# Patient Record
Sex: Female | Born: 1935 | Race: White | Hispanic: No | Marital: Married | Smoking: Never smoker
Health system: Southern US, Community
[De-identification: ages and names within clinical notes are randomized; demographics above are authoritative.]

## PROBLEM LIST (undated history)

## (undated) DIAGNOSIS — Q210 Ventricular septal defect: Secondary | ICD-10-CM

## (undated) DIAGNOSIS — I10 Essential (primary) hypertension: Secondary | ICD-10-CM

## (undated) DIAGNOSIS — G629 Polyneuropathy, unspecified: Secondary | ICD-10-CM

## (undated) DIAGNOSIS — R2681 Unsteadiness on feet: Secondary | ICD-10-CM

## (undated) DIAGNOSIS — I639 Cerebral infarction, unspecified: Secondary | ICD-10-CM

## (undated) DIAGNOSIS — E538 Deficiency of other specified B group vitamins: Secondary | ICD-10-CM

## (undated) DIAGNOSIS — M810 Age-related osteoporosis without current pathological fracture: Secondary | ICD-10-CM

## (undated) DIAGNOSIS — Z01810 Encounter for preprocedural cardiovascular examination: Secondary | ICD-10-CM

## (undated) DIAGNOSIS — G4700 Insomnia, unspecified: Secondary | ICD-10-CM

## (undated) DIAGNOSIS — J309 Allergic rhinitis, unspecified: Secondary | ICD-10-CM

## (undated) DIAGNOSIS — Z9889 Other specified postprocedural states: Secondary | ICD-10-CM

## (undated) DIAGNOSIS — F419 Anxiety disorder, unspecified: Secondary | ICD-10-CM

## (undated) DIAGNOSIS — I714 Abdominal aortic aneurysm, without rupture: Secondary | ICD-10-CM

## (undated) DIAGNOSIS — E875 Hyperkalemia: Secondary | ICD-10-CM

## (undated) DIAGNOSIS — R079 Chest pain, unspecified: Secondary | ICD-10-CM

## (undated) DIAGNOSIS — K219 Gastro-esophageal reflux disease without esophagitis: Secondary | ICD-10-CM

## (undated) HISTORY — DX: Abdominal aortic aneurysm, without rupture: I71.4

## (undated) HISTORY — DX: Encounter for preprocedural cardiovascular examination: Z01.810

## (undated) HISTORY — DX: Chest pain, unspecified: R07.9

## (undated) HISTORY — DX: Gastro-esophageal reflux disease without esophagitis: K21.9

## (undated) HISTORY — PX: CHOLECYSTECTOMY: SHX55

## (undated) HISTORY — PX: ABDOMINAL HYSTERECTOMY: SHX81

## (undated) HISTORY — DX: Cerebral infarction, unspecified: I63.9

## (undated) HISTORY — DX: Ventricular septal defect: Q21.0

## (undated) HISTORY — DX: Polyneuropathy, unspecified: G62.9

## (undated) HISTORY — DX: Age-related osteoporosis without current pathological fracture: M81.0

## (undated) HISTORY — PX: CATARACT EXTRACTION: SUR2

## (undated) HISTORY — DX: Unsteadiness on feet: R26.81

## (undated) HISTORY — DX: Anxiety disorder, unspecified: F41.9

## (undated) HISTORY — DX: Essential (primary) hypertension: I10

## (undated) HISTORY — DX: Allergic rhinitis, unspecified: J30.9

## (undated) HISTORY — PX: CARDIAC SURGERY: SHX584

## (undated) HISTORY — DX: Insomnia, unspecified: G47.00

## (undated) HISTORY — DX: Hyperkalemia: E87.5

## (undated) HISTORY — DX: Deficiency of other specified B group vitamins: E53.8

---

## 2014-04-06 DIAGNOSIS — K219 Gastro-esophageal reflux disease without esophagitis: Secondary | ICD-10-CM | POA: Diagnosis not present

## 2014-04-06 DIAGNOSIS — N184 Chronic kidney disease, stage 4 (severe): Secondary | ICD-10-CM | POA: Diagnosis not present

## 2014-04-06 DIAGNOSIS — E780 Pure hypercholesterolemia: Secondary | ICD-10-CM | POA: Diagnosis not present

## 2014-04-06 DIAGNOSIS — I6789 Other cerebrovascular disease: Secondary | ICD-10-CM | POA: Diagnosis not present

## 2014-04-06 DIAGNOSIS — M810 Age-related osteoporosis without current pathological fracture: Secondary | ICD-10-CM | POA: Diagnosis not present

## 2014-04-06 DIAGNOSIS — I10 Essential (primary) hypertension: Secondary | ICD-10-CM | POA: Diagnosis not present

## 2014-04-06 DIAGNOSIS — R609 Edema, unspecified: Secondary | ICD-10-CM | POA: Diagnosis not present

## 2014-04-06 DIAGNOSIS — M159 Polyosteoarthritis, unspecified: Secondary | ICD-10-CM | POA: Diagnosis not present

## 2014-06-05 DIAGNOSIS — R609 Edema, unspecified: Secondary | ICD-10-CM | POA: Diagnosis not present

## 2014-06-05 DIAGNOSIS — E780 Pure hypercholesterolemia: Secondary | ICD-10-CM | POA: Diagnosis not present

## 2014-06-05 DIAGNOSIS — M810 Age-related osteoporosis without current pathological fracture: Secondary | ICD-10-CM | POA: Diagnosis not present

## 2014-06-05 DIAGNOSIS — I10 Essential (primary) hypertension: Secondary | ICD-10-CM | POA: Diagnosis not present

## 2014-06-05 DIAGNOSIS — M25561 Pain in right knee: Secondary | ICD-10-CM | POA: Diagnosis not present

## 2014-06-05 DIAGNOSIS — N184 Chronic kidney disease, stage 4 (severe): Secondary | ICD-10-CM | POA: Diagnosis not present

## 2014-06-05 DIAGNOSIS — M159 Polyosteoarthritis, unspecified: Secondary | ICD-10-CM | POA: Diagnosis not present

## 2014-06-05 DIAGNOSIS — I6789 Other cerebrovascular disease: Secondary | ICD-10-CM | POA: Diagnosis not present

## 2014-06-08 DIAGNOSIS — Z6822 Body mass index (BMI) 22.0-22.9, adult: Secondary | ICD-10-CM | POA: Diagnosis not present

## 2014-06-08 DIAGNOSIS — N390 Urinary tract infection, site not specified: Secondary | ICD-10-CM | POA: Diagnosis not present

## 2014-07-06 DIAGNOSIS — M25512 Pain in left shoulder: Secondary | ICD-10-CM | POA: Diagnosis not present

## 2014-07-06 DIAGNOSIS — N184 Chronic kidney disease, stage 4 (severe): Secondary | ICD-10-CM | POA: Diagnosis not present

## 2014-07-06 DIAGNOSIS — I6789 Other cerebrovascular disease: Secondary | ICD-10-CM | POA: Diagnosis not present

## 2014-07-06 DIAGNOSIS — M159 Polyosteoarthritis, unspecified: Secondary | ICD-10-CM | POA: Diagnosis not present

## 2014-07-06 DIAGNOSIS — K219 Gastro-esophageal reflux disease without esophagitis: Secondary | ICD-10-CM | POA: Diagnosis not present

## 2014-07-06 DIAGNOSIS — R609 Edema, unspecified: Secondary | ICD-10-CM | POA: Diagnosis not present

## 2014-07-06 DIAGNOSIS — I10 Essential (primary) hypertension: Secondary | ICD-10-CM | POA: Diagnosis not present

## 2014-07-06 DIAGNOSIS — E780 Pure hypercholesterolemia: Secondary | ICD-10-CM | POA: Diagnosis not present

## 2014-07-16 DIAGNOSIS — K219 Gastro-esophageal reflux disease without esophagitis: Secondary | ICD-10-CM | POA: Diagnosis not present

## 2014-07-16 DIAGNOSIS — I6789 Other cerebrovascular disease: Secondary | ICD-10-CM | POA: Diagnosis not present

## 2014-07-16 DIAGNOSIS — R609 Edema, unspecified: Secondary | ICD-10-CM | POA: Diagnosis not present

## 2014-07-16 DIAGNOSIS — I10 Essential (primary) hypertension: Secondary | ICD-10-CM | POA: Diagnosis not present

## 2014-07-16 DIAGNOSIS — N184 Chronic kidney disease, stage 4 (severe): Secondary | ICD-10-CM | POA: Diagnosis not present

## 2014-07-16 DIAGNOSIS — E780 Pure hypercholesterolemia: Secondary | ICD-10-CM | POA: Diagnosis not present

## 2014-07-16 DIAGNOSIS — M159 Polyosteoarthritis, unspecified: Secondary | ICD-10-CM | POA: Diagnosis not present

## 2014-07-16 DIAGNOSIS — M810 Age-related osteoporosis without current pathological fracture: Secondary | ICD-10-CM | POA: Diagnosis not present

## 2014-07-24 DIAGNOSIS — I6789 Other cerebrovascular disease: Secondary | ICD-10-CM | POA: Diagnosis not present

## 2014-07-24 DIAGNOSIS — K219 Gastro-esophageal reflux disease without esophagitis: Secondary | ICD-10-CM | POA: Diagnosis not present

## 2014-07-24 DIAGNOSIS — N184 Chronic kidney disease, stage 4 (severe): Secondary | ICD-10-CM | POA: Diagnosis not present

## 2014-07-24 DIAGNOSIS — R609 Edema, unspecified: Secondary | ICD-10-CM | POA: Diagnosis not present

## 2014-07-24 DIAGNOSIS — M159 Polyosteoarthritis, unspecified: Secondary | ICD-10-CM | POA: Diagnosis not present

## 2014-07-24 DIAGNOSIS — M810 Age-related osteoporosis without current pathological fracture: Secondary | ICD-10-CM | POA: Diagnosis not present

## 2014-07-24 DIAGNOSIS — E780 Pure hypercholesterolemia: Secondary | ICD-10-CM | POA: Diagnosis not present

## 2014-07-24 DIAGNOSIS — I10 Essential (primary) hypertension: Secondary | ICD-10-CM | POA: Diagnosis not present

## 2014-08-07 DIAGNOSIS — E780 Pure hypercholesterolemia: Secondary | ICD-10-CM | POA: Diagnosis not present

## 2014-08-07 DIAGNOSIS — I6789 Other cerebrovascular disease: Secondary | ICD-10-CM | POA: Diagnosis not present

## 2014-08-07 DIAGNOSIS — Z9181 History of falling: Secondary | ICD-10-CM | POA: Diagnosis not present

## 2014-08-07 DIAGNOSIS — K219 Gastro-esophageal reflux disease without esophagitis: Secondary | ICD-10-CM | POA: Diagnosis not present

## 2014-08-07 DIAGNOSIS — R609 Edema, unspecified: Secondary | ICD-10-CM | POA: Diagnosis not present

## 2014-08-07 DIAGNOSIS — Z1389 Encounter for screening for other disorder: Secondary | ICD-10-CM | POA: Diagnosis not present

## 2014-08-07 DIAGNOSIS — M159 Polyosteoarthritis, unspecified: Secondary | ICD-10-CM | POA: Diagnosis not present

## 2014-08-07 DIAGNOSIS — N184 Chronic kidney disease, stage 4 (severe): Secondary | ICD-10-CM | POA: Diagnosis not present

## 2014-09-07 DIAGNOSIS — M810 Age-related osteoporosis without current pathological fracture: Secondary | ICD-10-CM | POA: Diagnosis not present

## 2014-09-11 DIAGNOSIS — M810 Age-related osteoporosis without current pathological fracture: Secondary | ICD-10-CM | POA: Diagnosis not present

## 2014-09-11 DIAGNOSIS — K219 Gastro-esophageal reflux disease without esophagitis: Secondary | ICD-10-CM | POA: Diagnosis not present

## 2014-09-11 DIAGNOSIS — M25561 Pain in right knee: Secondary | ICD-10-CM | POA: Diagnosis not present

## 2014-09-11 DIAGNOSIS — I639 Cerebral infarction, unspecified: Secondary | ICD-10-CM | POA: Diagnosis not present

## 2014-09-11 DIAGNOSIS — R609 Edema, unspecified: Secondary | ICD-10-CM | POA: Diagnosis not present

## 2014-09-11 DIAGNOSIS — E780 Pure hypercholesterolemia: Secondary | ICD-10-CM | POA: Diagnosis not present

## 2014-09-11 DIAGNOSIS — I10 Essential (primary) hypertension: Secondary | ICD-10-CM | POA: Diagnosis not present

## 2014-09-11 DIAGNOSIS — M159 Polyosteoarthritis, unspecified: Secondary | ICD-10-CM | POA: Diagnosis not present

## 2014-11-28 DIAGNOSIS — R2681 Unsteadiness on feet: Secondary | ICD-10-CM | POA: Diagnosis not present

## 2014-11-28 DIAGNOSIS — I639 Cerebral infarction, unspecified: Secondary | ICD-10-CM | POA: Diagnosis not present

## 2014-11-28 DIAGNOSIS — M810 Age-related osteoporosis without current pathological fracture: Secondary | ICD-10-CM | POA: Diagnosis not present

## 2014-11-28 DIAGNOSIS — M159 Polyosteoarthritis, unspecified: Secondary | ICD-10-CM | POA: Diagnosis not present

## 2014-11-28 DIAGNOSIS — R609 Edema, unspecified: Secondary | ICD-10-CM | POA: Diagnosis not present

## 2014-11-28 DIAGNOSIS — E780 Pure hypercholesterolemia: Secondary | ICD-10-CM | POA: Diagnosis not present

## 2014-11-28 DIAGNOSIS — K219 Gastro-esophageal reflux disease without esophagitis: Secondary | ICD-10-CM | POA: Diagnosis not present

## 2014-11-28 DIAGNOSIS — R5382 Chronic fatigue, unspecified: Secondary | ICD-10-CM | POA: Diagnosis not present

## 2014-12-17 DIAGNOSIS — I10 Essential (primary) hypertension: Secondary | ICD-10-CM | POA: Diagnosis not present

## 2014-12-17 DIAGNOSIS — R609 Edema, unspecified: Secondary | ICD-10-CM | POA: Diagnosis not present

## 2014-12-17 DIAGNOSIS — K219 Gastro-esophageal reflux disease without esophagitis: Secondary | ICD-10-CM | POA: Diagnosis not present

## 2014-12-17 DIAGNOSIS — M159 Polyosteoarthritis, unspecified: Secondary | ICD-10-CM | POA: Diagnosis not present

## 2014-12-17 DIAGNOSIS — I639 Cerebral infarction, unspecified: Secondary | ICD-10-CM | POA: Diagnosis not present

## 2014-12-17 DIAGNOSIS — E780 Pure hypercholesterolemia: Secondary | ICD-10-CM | POA: Diagnosis not present

## 2014-12-17 DIAGNOSIS — N184 Chronic kidney disease, stage 4 (severe): Secondary | ICD-10-CM | POA: Diagnosis not present

## 2014-12-17 DIAGNOSIS — M810 Age-related osteoporosis without current pathological fracture: Secondary | ICD-10-CM | POA: Diagnosis not present

## 2014-12-24 DIAGNOSIS — E780 Pure hypercholesterolemia: Secondary | ICD-10-CM | POA: Diagnosis not present

## 2014-12-24 DIAGNOSIS — M810 Age-related osteoporosis without current pathological fracture: Secondary | ICD-10-CM | POA: Diagnosis not present

## 2014-12-24 DIAGNOSIS — I639 Cerebral infarction, unspecified: Secondary | ICD-10-CM | POA: Diagnosis not present

## 2014-12-24 DIAGNOSIS — I10 Essential (primary) hypertension: Secondary | ICD-10-CM | POA: Diagnosis not present

## 2014-12-24 DIAGNOSIS — K219 Gastro-esophageal reflux disease without esophagitis: Secondary | ICD-10-CM | POA: Diagnosis not present

## 2014-12-24 DIAGNOSIS — Z139 Encounter for screening, unspecified: Secondary | ICD-10-CM | POA: Diagnosis not present

## 2014-12-24 DIAGNOSIS — N184 Chronic kidney disease, stage 4 (severe): Secondary | ICD-10-CM | POA: Diagnosis not present

## 2014-12-24 DIAGNOSIS — M159 Polyosteoarthritis, unspecified: Secondary | ICD-10-CM | POA: Diagnosis not present

## 2014-12-26 DIAGNOSIS — M810 Age-related osteoporosis without current pathological fracture: Secondary | ICD-10-CM | POA: Diagnosis not present

## 2014-12-26 DIAGNOSIS — Z23 Encounter for immunization: Secondary | ICD-10-CM | POA: Diagnosis not present

## 2014-12-26 DIAGNOSIS — M159 Polyosteoarthritis, unspecified: Secondary | ICD-10-CM | POA: Diagnosis not present

## 2014-12-26 DIAGNOSIS — E780 Pure hypercholesterolemia: Secondary | ICD-10-CM | POA: Diagnosis not present

## 2014-12-26 DIAGNOSIS — K219 Gastro-esophageal reflux disease without esophagitis: Secondary | ICD-10-CM | POA: Diagnosis not present

## 2014-12-26 DIAGNOSIS — N184 Chronic kidney disease, stage 4 (severe): Secondary | ICD-10-CM | POA: Diagnosis not present

## 2014-12-26 DIAGNOSIS — I639 Cerebral infarction, unspecified: Secondary | ICD-10-CM | POA: Diagnosis not present

## 2014-12-26 DIAGNOSIS — I10 Essential (primary) hypertension: Secondary | ICD-10-CM | POA: Diagnosis not present

## 2015-01-10 DIAGNOSIS — R2681 Unsteadiness on feet: Secondary | ICD-10-CM | POA: Diagnosis not present

## 2015-01-14 DIAGNOSIS — M25561 Pain in right knee: Secondary | ICD-10-CM | POA: Diagnosis not present

## 2015-01-14 DIAGNOSIS — I10 Essential (primary) hypertension: Secondary | ICD-10-CM | POA: Diagnosis not present

## 2015-01-14 DIAGNOSIS — Z7982 Long term (current) use of aspirin: Secondary | ICD-10-CM | POA: Diagnosis not present

## 2015-01-14 DIAGNOSIS — I2510 Atherosclerotic heart disease of native coronary artery without angina pectoris: Secondary | ICD-10-CM | POA: Diagnosis not present

## 2015-01-14 DIAGNOSIS — R2681 Unsteadiness on feet: Secondary | ICD-10-CM | POA: Diagnosis not present

## 2015-01-14 DIAGNOSIS — Z9181 History of falling: Secondary | ICD-10-CM | POA: Diagnosis not present

## 2015-01-16 DIAGNOSIS — R2681 Unsteadiness on feet: Secondary | ICD-10-CM | POA: Diagnosis not present

## 2015-01-16 DIAGNOSIS — Z7982 Long term (current) use of aspirin: Secondary | ICD-10-CM | POA: Diagnosis not present

## 2015-01-16 DIAGNOSIS — I10 Essential (primary) hypertension: Secondary | ICD-10-CM | POA: Diagnosis not present

## 2015-01-16 DIAGNOSIS — Z9181 History of falling: Secondary | ICD-10-CM | POA: Diagnosis not present

## 2015-01-16 DIAGNOSIS — I2510 Atherosclerotic heart disease of native coronary artery without angina pectoris: Secondary | ICD-10-CM | POA: Diagnosis not present

## 2015-01-16 DIAGNOSIS — M25561 Pain in right knee: Secondary | ICD-10-CM | POA: Diagnosis not present

## 2015-01-23 DIAGNOSIS — R2681 Unsteadiness on feet: Secondary | ICD-10-CM | POA: Diagnosis not present

## 2015-01-23 DIAGNOSIS — Z9181 History of falling: Secondary | ICD-10-CM | POA: Diagnosis not present

## 2015-01-23 DIAGNOSIS — I2510 Atherosclerotic heart disease of native coronary artery without angina pectoris: Secondary | ICD-10-CM | POA: Diagnosis not present

## 2015-01-23 DIAGNOSIS — Z7982 Long term (current) use of aspirin: Secondary | ICD-10-CM | POA: Diagnosis not present

## 2015-01-23 DIAGNOSIS — I10 Essential (primary) hypertension: Secondary | ICD-10-CM | POA: Diagnosis not present

## 2015-01-23 DIAGNOSIS — M25561 Pain in right knee: Secondary | ICD-10-CM | POA: Diagnosis not present

## 2015-01-24 DIAGNOSIS — M5416 Radiculopathy, lumbar region: Secondary | ICD-10-CM | POA: Diagnosis not present

## 2015-01-28 DIAGNOSIS — Z9181 History of falling: Secondary | ICD-10-CM | POA: Diagnosis not present

## 2015-01-28 DIAGNOSIS — I10 Essential (primary) hypertension: Secondary | ICD-10-CM | POA: Diagnosis not present

## 2015-01-28 DIAGNOSIS — I2510 Atherosclerotic heart disease of native coronary artery without angina pectoris: Secondary | ICD-10-CM | POA: Diagnosis not present

## 2015-01-28 DIAGNOSIS — R2681 Unsteadiness on feet: Secondary | ICD-10-CM | POA: Diagnosis not present

## 2015-01-28 DIAGNOSIS — M25561 Pain in right knee: Secondary | ICD-10-CM | POA: Diagnosis not present

## 2015-01-28 DIAGNOSIS — Z7982 Long term (current) use of aspirin: Secondary | ICD-10-CM | POA: Diagnosis not present

## 2015-01-30 DIAGNOSIS — M25561 Pain in right knee: Secondary | ICD-10-CM | POA: Diagnosis not present

## 2015-01-30 DIAGNOSIS — Z9181 History of falling: Secondary | ICD-10-CM | POA: Diagnosis not present

## 2015-01-30 DIAGNOSIS — I2510 Atherosclerotic heart disease of native coronary artery without angina pectoris: Secondary | ICD-10-CM | POA: Diagnosis not present

## 2015-01-30 DIAGNOSIS — R2681 Unsteadiness on feet: Secondary | ICD-10-CM | POA: Diagnosis not present

## 2015-01-30 DIAGNOSIS — Z7982 Long term (current) use of aspirin: Secondary | ICD-10-CM | POA: Diagnosis not present

## 2015-01-30 DIAGNOSIS — I10 Essential (primary) hypertension: Secondary | ICD-10-CM | POA: Diagnosis not present

## 2015-02-04 DIAGNOSIS — R2681 Unsteadiness on feet: Secondary | ICD-10-CM | POA: Diagnosis not present

## 2015-02-04 DIAGNOSIS — I2510 Atherosclerotic heart disease of native coronary artery without angina pectoris: Secondary | ICD-10-CM | POA: Diagnosis not present

## 2015-02-04 DIAGNOSIS — M25561 Pain in right knee: Secondary | ICD-10-CM | POA: Diagnosis not present

## 2015-02-04 DIAGNOSIS — Z9181 History of falling: Secondary | ICD-10-CM | POA: Diagnosis not present

## 2015-02-04 DIAGNOSIS — I10 Essential (primary) hypertension: Secondary | ICD-10-CM | POA: Diagnosis not present

## 2015-02-04 DIAGNOSIS — Z7982 Long term (current) use of aspirin: Secondary | ICD-10-CM | POA: Diagnosis not present

## 2015-02-06 DIAGNOSIS — Z9181 History of falling: Secondary | ICD-10-CM | POA: Diagnosis not present

## 2015-02-06 DIAGNOSIS — R2681 Unsteadiness on feet: Secondary | ICD-10-CM | POA: Diagnosis not present

## 2015-02-06 DIAGNOSIS — Z7982 Long term (current) use of aspirin: Secondary | ICD-10-CM | POA: Diagnosis not present

## 2015-02-06 DIAGNOSIS — I10 Essential (primary) hypertension: Secondary | ICD-10-CM | POA: Diagnosis not present

## 2015-02-06 DIAGNOSIS — I2510 Atherosclerotic heart disease of native coronary artery without angina pectoris: Secondary | ICD-10-CM | POA: Diagnosis not present

## 2015-02-06 DIAGNOSIS — M25561 Pain in right knee: Secondary | ICD-10-CM | POA: Diagnosis not present

## 2015-02-07 DIAGNOSIS — G629 Polyneuropathy, unspecified: Secondary | ICD-10-CM | POA: Diagnosis not present

## 2015-02-07 DIAGNOSIS — R2681 Unsteadiness on feet: Secondary | ICD-10-CM | POA: Diagnosis not present

## 2015-02-11 DIAGNOSIS — R2681 Unsteadiness on feet: Secondary | ICD-10-CM | POA: Diagnosis not present

## 2015-02-11 DIAGNOSIS — Z7982 Long term (current) use of aspirin: Secondary | ICD-10-CM | POA: Diagnosis not present

## 2015-02-11 DIAGNOSIS — I10 Essential (primary) hypertension: Secondary | ICD-10-CM | POA: Diagnosis not present

## 2015-02-11 DIAGNOSIS — Z9181 History of falling: Secondary | ICD-10-CM | POA: Diagnosis not present

## 2015-02-11 DIAGNOSIS — I2510 Atherosclerotic heart disease of native coronary artery without angina pectoris: Secondary | ICD-10-CM | POA: Diagnosis not present

## 2015-02-11 DIAGNOSIS — M25561 Pain in right knee: Secondary | ICD-10-CM | POA: Diagnosis not present

## 2015-02-13 DIAGNOSIS — D500 Iron deficiency anemia secondary to blood loss (chronic): Secondary | ICD-10-CM | POA: Diagnosis not present

## 2015-02-13 DIAGNOSIS — K219 Gastro-esophageal reflux disease without esophagitis: Secondary | ICD-10-CM | POA: Diagnosis not present

## 2015-02-13 DIAGNOSIS — K591 Functional diarrhea: Secondary | ICD-10-CM | POA: Diagnosis not present

## 2015-02-14 DIAGNOSIS — K219 Gastro-esophageal reflux disease without esophagitis: Secondary | ICD-10-CM | POA: Diagnosis not present

## 2015-02-14 DIAGNOSIS — E7800 Pure hypercholesterolemia, unspecified: Secondary | ICD-10-CM | POA: Diagnosis not present

## 2015-02-14 DIAGNOSIS — M159 Polyosteoarthritis, unspecified: Secondary | ICD-10-CM | POA: Diagnosis not present

## 2015-02-14 DIAGNOSIS — K529 Noninfective gastroenteritis and colitis, unspecified: Secondary | ICD-10-CM | POA: Diagnosis not present

## 2015-02-14 DIAGNOSIS — M810 Age-related osteoporosis without current pathological fracture: Secondary | ICD-10-CM | POA: Diagnosis not present

## 2015-02-14 DIAGNOSIS — N184 Chronic kidney disease, stage 4 (severe): Secondary | ICD-10-CM | POA: Diagnosis not present

## 2015-02-14 DIAGNOSIS — I10 Essential (primary) hypertension: Secondary | ICD-10-CM | POA: Diagnosis not present

## 2015-02-14 DIAGNOSIS — I639 Cerebral infarction, unspecified: Secondary | ICD-10-CM | POA: Diagnosis not present

## 2015-02-19 DIAGNOSIS — R197 Diarrhea, unspecified: Secondary | ICD-10-CM | POA: Diagnosis not present

## 2015-02-19 DIAGNOSIS — K449 Diaphragmatic hernia without obstruction or gangrene: Secondary | ICD-10-CM | POA: Diagnosis not present

## 2015-02-19 DIAGNOSIS — K219 Gastro-esophageal reflux disease without esophagitis: Secondary | ICD-10-CM | POA: Diagnosis not present

## 2015-02-19 DIAGNOSIS — K5730 Diverticulosis of large intestine without perforation or abscess without bleeding: Secondary | ICD-10-CM | POA: Diagnosis not present

## 2015-02-19 DIAGNOSIS — K2210 Ulcer of esophagus without bleeding: Secondary | ICD-10-CM | POA: Diagnosis not present

## 2015-02-19 DIAGNOSIS — D509 Iron deficiency anemia, unspecified: Secondary | ICD-10-CM | POA: Diagnosis not present

## 2015-02-19 DIAGNOSIS — Z86010 Personal history of colonic polyps: Secondary | ICD-10-CM | POA: Diagnosis not present

## 2015-02-19 DIAGNOSIS — Z9049 Acquired absence of other specified parts of digestive tract: Secondary | ICD-10-CM | POA: Diagnosis not present

## 2015-02-19 DIAGNOSIS — R1310 Dysphagia, unspecified: Secondary | ICD-10-CM | POA: Diagnosis not present

## 2015-02-19 DIAGNOSIS — Z800 Family history of malignant neoplasm of digestive organs: Secondary | ICD-10-CM | POA: Diagnosis not present

## 2015-02-19 DIAGNOSIS — D649 Anemia, unspecified: Secondary | ICD-10-CM | POA: Diagnosis not present

## 2015-02-19 DIAGNOSIS — I10 Essential (primary) hypertension: Secondary | ICD-10-CM | POA: Diagnosis not present

## 2015-02-19 DIAGNOSIS — K6389 Other specified diseases of intestine: Secondary | ICD-10-CM | POA: Diagnosis not present

## 2015-02-27 DIAGNOSIS — Z1389 Encounter for screening for other disorder: Secondary | ICD-10-CM | POA: Diagnosis not present

## 2015-02-27 DIAGNOSIS — M810 Age-related osteoporosis without current pathological fracture: Secondary | ICD-10-CM | POA: Diagnosis not present

## 2015-02-27 DIAGNOSIS — M159 Polyosteoarthritis, unspecified: Secondary | ICD-10-CM | POA: Diagnosis not present

## 2015-02-27 DIAGNOSIS — I639 Cerebral infarction, unspecified: Secondary | ICD-10-CM | POA: Diagnosis not present

## 2015-02-27 DIAGNOSIS — I10 Essential (primary) hypertension: Secondary | ICD-10-CM | POA: Diagnosis not present

## 2015-02-27 DIAGNOSIS — K219 Gastro-esophageal reflux disease without esophagitis: Secondary | ICD-10-CM | POA: Diagnosis not present

## 2015-02-27 DIAGNOSIS — E7800 Pure hypercholesterolemia, unspecified: Secondary | ICD-10-CM | POA: Diagnosis not present

## 2015-02-27 DIAGNOSIS — E538 Deficiency of other specified B group vitamins: Secondary | ICD-10-CM | POA: Diagnosis not present

## 2015-03-01 DIAGNOSIS — I2510 Atherosclerotic heart disease of native coronary artery without angina pectoris: Secondary | ICD-10-CM | POA: Diagnosis not present

## 2015-03-01 DIAGNOSIS — R2681 Unsteadiness on feet: Secondary | ICD-10-CM | POA: Diagnosis not present

## 2015-03-01 DIAGNOSIS — M25561 Pain in right knee: Secondary | ICD-10-CM | POA: Diagnosis not present

## 2015-03-01 DIAGNOSIS — Z7982 Long term (current) use of aspirin: Secondary | ICD-10-CM | POA: Diagnosis not present

## 2015-03-01 DIAGNOSIS — Z9181 History of falling: Secondary | ICD-10-CM | POA: Diagnosis not present

## 2015-03-01 DIAGNOSIS — I10 Essential (primary) hypertension: Secondary | ICD-10-CM | POA: Diagnosis not present

## 2015-03-13 ENCOUNTER — Emergency Department (HOSPITAL_COMMUNITY): Payer: Commercial Managed Care - HMO

## 2015-03-13 ENCOUNTER — Encounter (HOSPITAL_COMMUNITY): Payer: Self-pay | Admitting: Emergency Medicine

## 2015-03-13 ENCOUNTER — Emergency Department (HOSPITAL_COMMUNITY)
Admission: EM | Admit: 2015-03-13 | Discharge: 2015-03-14 | Disposition: A | Discharge status: Home / Self Care | Payer: Commercial Managed Care - HMO | Attending: Emergency Medicine | Admitting: Emergency Medicine

## 2015-03-13 DIAGNOSIS — Y998 Other external cause status: Secondary | ICD-10-CM | POA: Insufficient documentation

## 2015-03-13 DIAGNOSIS — M79622 Pain in left upper arm: Secondary | ICD-10-CM | POA: Diagnosis not present

## 2015-03-13 DIAGNOSIS — Y9389 Activity, other specified: Secondary | ICD-10-CM | POA: Insufficient documentation

## 2015-03-13 DIAGNOSIS — S51811A Laceration without foreign body of right forearm, initial encounter: Secondary | ICD-10-CM | POA: Diagnosis not present

## 2015-03-13 DIAGNOSIS — I10 Essential (primary) hypertension: Secondary | ICD-10-CM | POA: Diagnosis not present

## 2015-03-13 DIAGNOSIS — T148 Other injury of unspecified body region: Secondary | ICD-10-CM | POA: Diagnosis not present

## 2015-03-13 DIAGNOSIS — M25519 Pain in unspecified shoulder: Secondary | ICD-10-CM | POA: Diagnosis not present

## 2015-03-13 DIAGNOSIS — Y92410 Unspecified street and highway as the place of occurrence of the external cause: Secondary | ICD-10-CM | POA: Diagnosis not present

## 2015-03-13 DIAGNOSIS — S51812A Laceration without foreign body of left forearm, initial encounter: Secondary | ICD-10-CM | POA: Insufficient documentation

## 2015-03-13 DIAGNOSIS — S4992XA Unspecified injury of left shoulder and upper arm, initial encounter: Secondary | ICD-10-CM | POA: Diagnosis present

## 2015-03-13 DIAGNOSIS — M25512 Pain in left shoulder: Secondary | ICD-10-CM | POA: Diagnosis not present

## 2015-03-13 DIAGNOSIS — S51819A Laceration without foreign body of unspecified forearm, initial encounter: Secondary | ICD-10-CM

## 2015-03-13 DIAGNOSIS — Z79899 Other long term (current) drug therapy: Secondary | ICD-10-CM | POA: Insufficient documentation

## 2015-03-13 DIAGNOSIS — S40012A Contusion of left shoulder, initial encounter: Secondary | ICD-10-CM | POA: Diagnosis not present

## 2015-03-13 HISTORY — DX: Essential (primary) hypertension: I10

## 2015-03-13 HISTORY — DX: Other specified postprocedural states: Z98.890

## 2015-03-13 MED ORDER — BUPIVACAINE HCL (PF) 0.5 % IJ SOLN: 20.0000 mL | Freq: Once | INTRAMUSCULAR | Status: DC

## 2015-03-13 MED ORDER — FLUORESCEIN SODIUM 1 MG OP STRP
1.0000 | ORAL_STRIP | Freq: Once | OPHTHALMIC | Status: AC
  Administered 2015-03-13: 1 via OPHTHALMIC
  Filled 2015-03-13: qty 1

## 2015-03-13 MED ORDER — TETRACAINE HCL 0.5 % OP SOLN
1.0000 [drp] | Freq: Once | OPHTHALMIC | Status: AC
  Administered 2015-03-13: 1 [drp] via OPHTHALMIC
  Filled 2015-03-13: qty 2

## 2015-03-13 MED ORDER — BUPIVACAINE HCL 0.5 % IJ SOLN
20.0000 mL | Freq: Once | INTRAMUSCULAR | Status: DC
  Filled 2015-03-13: qty 20

## 2015-03-13 MED ORDER — ONDANSETRON HCL 4 MG/2ML IJ SOLN
4.0000 mg | Freq: Once | INTRAMUSCULAR | Status: AC
  Administered 2015-03-13: 4 mg via INTRAVENOUS
  Filled 2015-03-13: qty 2

## 2015-03-13 MED ORDER — LIDOCAINE HCL (PF) 1 % IJ SOLN: 20.0000 mL | Freq: Once | INTRAMUSCULAR | Status: DC

## 2015-03-13 MED ORDER — FENTANYL CITRATE (PF) 100 MCG/2ML IJ SOLN
50.0000 ug | Freq: Once | INTRAMUSCULAR | Status: AC
  Administered 2015-03-13: 50 ug via INTRAVENOUS
  Filled 2015-03-13: qty 2

## 2015-03-13 MED ORDER — PROMETHAZINE HCL 25 MG/ML IJ SOLN
12.5000 mg | Freq: Once | INTRAMUSCULAR | Status: AC
  Administered 2015-03-14: 12.5 mg via INTRAVENOUS
  Filled 2015-03-13: qty 1
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
## 2015-03-13 NOTE — ED Notes (Signed)
PA at bedside.

## 2015-03-14 DIAGNOSIS — S51812A Laceration without foreign body of left forearm, initial encounter: Secondary | ICD-10-CM | POA: Diagnosis not present

## 2015-03-14 DIAGNOSIS — I10 Essential (primary) hypertension: Secondary | ICD-10-CM | POA: Diagnosis not present

## 2015-03-14 DIAGNOSIS — S51811A Laceration without foreign body of right forearm, initial encounter: Secondary | ICD-10-CM | POA: Diagnosis not present

## 2015-03-14 DIAGNOSIS — Z79899 Other long term (current) drug therapy: Secondary | ICD-10-CM | POA: Diagnosis not present

## 2015-03-14 MED ORDER — PROMETHAZINE HCL 12.5 MG PO TABS: 12.5000 mg | ORAL_TABLET | Freq: Four times a day (QID) | ORAL | Status: DC | PRN

## 2015-03-14 NOTE — Discharge Instructions (Signed)
Please read attached information. If you experience any new or worsening signs or symptoms please return to the emergency room for evaluation. Please follow-up with your primary care provider or specialist as discussed. Please use medication prescribed only as directed and discontinue taking if you have any concerning signs or symptoms. Please see primary care provider in 2 days for reevaluation.

## 2015-03-14 NOTE — ED Notes (Signed)
Patient and family member both verbalized discharge instructions and medication and deny any further needs or questions at this time. VS stable. Assisted to ED lobby in wheelchair.

## 2015-03-15 DIAGNOSIS — T148 Other injury of unspecified body region: Secondary | ICD-10-CM | POA: Diagnosis not present

## 2015-03-15 DIAGNOSIS — I639 Cerebral infarction, unspecified: Secondary | ICD-10-CM | POA: Diagnosis not present

## 2015-03-15 DIAGNOSIS — Z9181 History of falling: Secondary | ICD-10-CM | POA: Diagnosis not present

## 2015-03-15 DIAGNOSIS — Z1389 Encounter for screening for other disorder: Secondary | ICD-10-CM | POA: Diagnosis not present

## 2015-03-15 DIAGNOSIS — K219 Gastro-esophageal reflux disease without esophagitis: Secondary | ICD-10-CM | POA: Diagnosis not present

## 2015-03-15 DIAGNOSIS — M159 Polyosteoarthritis, unspecified: Secondary | ICD-10-CM | POA: Diagnosis not present

## 2015-03-15 DIAGNOSIS — E538 Deficiency of other specified B group vitamins: Secondary | ICD-10-CM | POA: Diagnosis not present

## 2015-03-15 DIAGNOSIS — E7800 Pure hypercholesterolemia, unspecified: Secondary | ICD-10-CM | POA: Diagnosis not present

## 2015-03-18 DIAGNOSIS — K219 Gastro-esophageal reflux disease without esophagitis: Secondary | ICD-10-CM | POA: Diagnosis not present

## 2015-03-18 DIAGNOSIS — N184 Chronic kidney disease, stage 4 (severe): Secondary | ICD-10-CM | POA: Diagnosis not present

## 2015-03-18 DIAGNOSIS — I639 Cerebral infarction, unspecified: Secondary | ICD-10-CM | POA: Diagnosis not present

## 2015-03-18 DIAGNOSIS — E7800 Pure hypercholesterolemia, unspecified: Secondary | ICD-10-CM | POA: Diagnosis not present

## 2015-03-18 DIAGNOSIS — I10 Essential (primary) hypertension: Secondary | ICD-10-CM | POA: Diagnosis not present

## 2015-03-18 DIAGNOSIS — M159 Polyosteoarthritis, unspecified: Secondary | ICD-10-CM | POA: Diagnosis not present

## 2015-03-18 DIAGNOSIS — S41111A Laceration without foreign body of right upper arm, initial encounter: Secondary | ICD-10-CM | POA: Diagnosis not present

## 2015-03-18 DIAGNOSIS — M810 Age-related osteoporosis without current pathological fracture: Secondary | ICD-10-CM | POA: Diagnosis not present

## 2015-03-22 DIAGNOSIS — J069 Acute upper respiratory infection, unspecified: Secondary | ICD-10-CM | POA: Diagnosis not present

## 2015-03-22 DIAGNOSIS — R05 Cough: Secondary | ICD-10-CM | POA: Diagnosis not present

## 2015-03-22 DIAGNOSIS — R509 Fever, unspecified: Secondary | ICD-10-CM | POA: Diagnosis not present

## 2015-03-22 DIAGNOSIS — H6121 Impacted cerumen, right ear: Secondary | ICD-10-CM | POA: Diagnosis not present

## 2015-03-22 DIAGNOSIS — J189 Pneumonia, unspecified organism: Secondary | ICD-10-CM | POA: Diagnosis not present

## 2015-03-26 DIAGNOSIS — I10 Essential (primary) hypertension: Secondary | ICD-10-CM | POA: Diagnosis not present

## 2015-03-26 DIAGNOSIS — K219 Gastro-esophageal reflux disease without esophagitis: Secondary | ICD-10-CM | POA: Diagnosis not present

## 2015-03-26 DIAGNOSIS — E7800 Pure hypercholesterolemia, unspecified: Secondary | ICD-10-CM | POA: Diagnosis not present

## 2015-03-26 DIAGNOSIS — I639 Cerebral infarction, unspecified: Secondary | ICD-10-CM | POA: Diagnosis not present

## 2015-03-26 DIAGNOSIS — N184 Chronic kidney disease, stage 4 (severe): Secondary | ICD-10-CM | POA: Diagnosis not present

## 2015-03-26 DIAGNOSIS — M810 Age-related osteoporosis without current pathological fracture: Secondary | ICD-10-CM | POA: Diagnosis not present

## 2015-03-26 DIAGNOSIS — J159 Unspecified bacterial pneumonia: Secondary | ICD-10-CM | POA: Diagnosis not present

## 2015-03-26 DIAGNOSIS — M159 Polyosteoarthritis, unspecified: Secondary | ICD-10-CM | POA: Diagnosis not present

## 2015-04-03 DIAGNOSIS — N184 Chronic kidney disease, stage 4 (severe): Secondary | ICD-10-CM | POA: Diagnosis not present

## 2015-04-03 DIAGNOSIS — R609 Edema, unspecified: Secondary | ICD-10-CM | POA: Diagnosis not present

## 2015-04-03 DIAGNOSIS — E7800 Pure hypercholesterolemia, unspecified: Secondary | ICD-10-CM | POA: Diagnosis not present

## 2015-04-03 DIAGNOSIS — M159 Polyosteoarthritis, unspecified: Secondary | ICD-10-CM | POA: Diagnosis not present

## 2015-04-03 DIAGNOSIS — M810 Age-related osteoporosis without current pathological fracture: Secondary | ICD-10-CM | POA: Diagnosis not present

## 2015-04-03 DIAGNOSIS — K219 Gastro-esophageal reflux disease without esophagitis: Secondary | ICD-10-CM | POA: Diagnosis not present

## 2015-04-03 DIAGNOSIS — I639 Cerebral infarction, unspecified: Secondary | ICD-10-CM | POA: Diagnosis not present

## 2015-04-03 DIAGNOSIS — I10 Essential (primary) hypertension: Secondary | ICD-10-CM | POA: Diagnosis not present

## 2015-04-03 DIAGNOSIS — R2681 Unsteadiness on feet: Secondary | ICD-10-CM | POA: Diagnosis not present

## 2015-04-17 DIAGNOSIS — E538 Deficiency of other specified B group vitamins: Secondary | ICD-10-CM | POA: Diagnosis not present

## 2015-04-17 DIAGNOSIS — E7800 Pure hypercholesterolemia, unspecified: Secondary | ICD-10-CM | POA: Diagnosis not present

## 2015-04-17 DIAGNOSIS — K219 Gastro-esophageal reflux disease without esophagitis: Secondary | ICD-10-CM | POA: Diagnosis not present

## 2015-04-17 DIAGNOSIS — M159 Polyosteoarthritis, unspecified: Secondary | ICD-10-CM | POA: Diagnosis not present

## 2015-04-17 DIAGNOSIS — I10 Essential (primary) hypertension: Secondary | ICD-10-CM | POA: Diagnosis not present

## 2015-04-17 DIAGNOSIS — M810 Age-related osteoporosis without current pathological fracture: Secondary | ICD-10-CM | POA: Diagnosis not present

## 2015-04-17 DIAGNOSIS — N184 Chronic kidney disease, stage 4 (severe): Secondary | ICD-10-CM | POA: Diagnosis not present

## 2015-04-17 DIAGNOSIS — R2681 Unsteadiness on feet: Secondary | ICD-10-CM | POA: Diagnosis not present

## 2015-04-17 DIAGNOSIS — R609 Edema, unspecified: Secondary | ICD-10-CM | POA: Diagnosis not present

## 2015-04-17 DIAGNOSIS — I639 Cerebral infarction, unspecified: Secondary | ICD-10-CM | POA: Diagnosis not present

## 2015-05-02 DIAGNOSIS — E7800 Pure hypercholesterolemia, unspecified: Secondary | ICD-10-CM | POA: Diagnosis not present

## 2015-05-02 DIAGNOSIS — R609 Edema, unspecified: Secondary | ICD-10-CM | POA: Diagnosis not present

## 2015-05-02 DIAGNOSIS — K219 Gastro-esophageal reflux disease without esophagitis: Secondary | ICD-10-CM | POA: Diagnosis not present

## 2015-05-02 DIAGNOSIS — M159 Polyosteoarthritis, unspecified: Secondary | ICD-10-CM | POA: Diagnosis not present

## 2015-05-02 DIAGNOSIS — R2681 Unsteadiness on feet: Secondary | ICD-10-CM | POA: Diagnosis not present

## 2015-05-02 DIAGNOSIS — N184 Chronic kidney disease, stage 4 (severe): Secondary | ICD-10-CM | POA: Diagnosis not present

## 2015-05-02 DIAGNOSIS — M810 Age-related osteoporosis without current pathological fracture: Secondary | ICD-10-CM | POA: Diagnosis not present

## 2015-05-02 DIAGNOSIS — I639 Cerebral infarction, unspecified: Secondary | ICD-10-CM | POA: Diagnosis not present

## 2015-05-02 DIAGNOSIS — I10 Essential (primary) hypertension: Secondary | ICD-10-CM | POA: Diagnosis not present

## 2015-05-10 DIAGNOSIS — R609 Edema, unspecified: Secondary | ICD-10-CM | POA: Diagnosis not present

## 2015-05-10 DIAGNOSIS — I639 Cerebral infarction, unspecified: Secondary | ICD-10-CM | POA: Diagnosis not present

## 2015-05-10 DIAGNOSIS — T148 Other injury of unspecified body region: Secondary | ICD-10-CM | POA: Diagnosis not present

## 2015-05-10 DIAGNOSIS — L0390 Cellulitis, unspecified: Secondary | ICD-10-CM | POA: Diagnosis not present

## 2015-05-10 DIAGNOSIS — Z1389 Encounter for screening for other disorder: Secondary | ICD-10-CM | POA: Diagnosis not present

## 2015-05-10 DIAGNOSIS — E7800 Pure hypercholesterolemia, unspecified: Secondary | ICD-10-CM | POA: Diagnosis not present

## 2015-05-10 DIAGNOSIS — M159 Polyosteoarthritis, unspecified: Secondary | ICD-10-CM | POA: Diagnosis not present

## 2015-05-10 DIAGNOSIS — K219 Gastro-esophageal reflux disease without esophagitis: Secondary | ICD-10-CM | POA: Diagnosis not present

## 2015-05-10 DIAGNOSIS — E538 Deficiency of other specified B group vitamins: Secondary | ICD-10-CM | POA: Diagnosis not present

## 2015-05-22 DIAGNOSIS — E7800 Pure hypercholesterolemia, unspecified: Secondary | ICD-10-CM | POA: Diagnosis not present

## 2015-05-22 DIAGNOSIS — N184 Chronic kidney disease, stage 4 (severe): Secondary | ICD-10-CM | POA: Diagnosis not present

## 2015-05-22 DIAGNOSIS — I10 Essential (primary) hypertension: Secondary | ICD-10-CM | POA: Diagnosis not present

## 2015-05-22 DIAGNOSIS — I639 Cerebral infarction, unspecified: Secondary | ICD-10-CM | POA: Diagnosis not present

## 2015-05-22 DIAGNOSIS — K219 Gastro-esophageal reflux disease without esophagitis: Secondary | ICD-10-CM | POA: Diagnosis not present

## 2015-05-22 DIAGNOSIS — T148 Other injury of unspecified body region: Secondary | ICD-10-CM | POA: Diagnosis not present

## 2015-05-22 DIAGNOSIS — M159 Polyosteoarthritis, unspecified: Secondary | ICD-10-CM | POA: Diagnosis not present

## 2015-05-22 DIAGNOSIS — E538 Deficiency of other specified B group vitamins: Secondary | ICD-10-CM | POA: Diagnosis not present

## 2015-05-22 DIAGNOSIS — M810 Age-related osteoporosis without current pathological fracture: Secondary | ICD-10-CM | POA: Diagnosis not present

## 2015-06-04 DIAGNOSIS — I10 Essential (primary) hypertension: Secondary | ICD-10-CM | POA: Diagnosis not present

## 2015-06-04 DIAGNOSIS — M159 Polyosteoarthritis, unspecified: Secondary | ICD-10-CM | POA: Diagnosis not present

## 2015-06-04 DIAGNOSIS — N184 Chronic kidney disease, stage 4 (severe): Secondary | ICD-10-CM | POA: Diagnosis not present

## 2015-06-04 DIAGNOSIS — E538 Deficiency of other specified B group vitamins: Secondary | ICD-10-CM | POA: Diagnosis not present

## 2015-06-04 DIAGNOSIS — M25561 Pain in right knee: Secondary | ICD-10-CM | POA: Diagnosis not present

## 2015-06-04 DIAGNOSIS — R937 Abnormal findings on diagnostic imaging of other parts of musculoskeletal system: Secondary | ICD-10-CM | POA: Diagnosis not present

## 2015-06-04 DIAGNOSIS — M25512 Pain in left shoulder: Secondary | ICD-10-CM | POA: Diagnosis not present

## 2015-06-04 DIAGNOSIS — K219 Gastro-esophageal reflux disease without esophagitis: Secondary | ICD-10-CM | POA: Diagnosis not present

## 2015-06-04 DIAGNOSIS — E7800 Pure hypercholesterolemia, unspecified: Secondary | ICD-10-CM | POA: Diagnosis not present

## 2015-06-04 DIAGNOSIS — M810 Age-related osteoporosis without current pathological fracture: Secondary | ICD-10-CM | POA: Diagnosis not present

## 2015-06-11 DIAGNOSIS — K219 Gastro-esophageal reflux disease without esophagitis: Secondary | ICD-10-CM | POA: Diagnosis not present

## 2015-06-11 DIAGNOSIS — R609 Edema, unspecified: Secondary | ICD-10-CM | POA: Diagnosis not present

## 2015-06-11 DIAGNOSIS — E538 Deficiency of other specified B group vitamins: Secondary | ICD-10-CM | POA: Diagnosis not present

## 2015-06-11 DIAGNOSIS — M159 Polyosteoarthritis, unspecified: Secondary | ICD-10-CM | POA: Diagnosis not present

## 2015-06-11 DIAGNOSIS — M810 Age-related osteoporosis without current pathological fracture: Secondary | ICD-10-CM | POA: Diagnosis not present

## 2015-06-11 DIAGNOSIS — T148 Other injury of unspecified body region: Secondary | ICD-10-CM | POA: Diagnosis not present

## 2015-06-11 DIAGNOSIS — R2681 Unsteadiness on feet: Secondary | ICD-10-CM | POA: Diagnosis not present

## 2015-06-11 DIAGNOSIS — E7800 Pure hypercholesterolemia, unspecified: Secondary | ICD-10-CM | POA: Diagnosis not present

## 2015-06-11 DIAGNOSIS — N184 Chronic kidney disease, stage 4 (severe): Secondary | ICD-10-CM | POA: Diagnosis not present

## 2015-06-14 DIAGNOSIS — M1711 Unilateral primary osteoarthritis, right knee: Secondary | ICD-10-CM | POA: Diagnosis not present

## 2015-06-21 DIAGNOSIS — M79609 Pain in unspecified limb: Secondary | ICD-10-CM | POA: Diagnosis not present

## 2015-06-21 DIAGNOSIS — Z01818 Encounter for other preprocedural examination: Secondary | ICD-10-CM | POA: Diagnosis not present

## 2015-06-21 DIAGNOSIS — Z01810 Encounter for preprocedural cardiovascular examination: Secondary | ICD-10-CM | POA: Diagnosis not present

## 2015-06-21 DIAGNOSIS — Z79899 Other long term (current) drug therapy: Secondary | ICD-10-CM | POA: Diagnosis not present

## 2015-06-21 DIAGNOSIS — R52 Pain, unspecified: Secondary | ICD-10-CM | POA: Diagnosis not present

## 2015-06-21 DIAGNOSIS — Z01812 Encounter for preprocedural laboratory examination: Secondary | ICD-10-CM | POA: Diagnosis not present

## 2015-06-26 DIAGNOSIS — G629 Polyneuropathy, unspecified: Secondary | ICD-10-CM | POA: Insufficient documentation

## 2015-06-26 DIAGNOSIS — I10 Essential (primary) hypertension: Secondary | ICD-10-CM

## 2015-06-26 DIAGNOSIS — Z9889 Other specified postprocedural states: Secondary | ICD-10-CM | POA: Insufficient documentation

## 2015-06-26 DIAGNOSIS — R2681 Unsteadiness on feet: Secondary | ICD-10-CM

## 2015-06-26 HISTORY — DX: Unsteadiness on feet: R26.81

## 2015-06-26 HISTORY — DX: Essential (primary) hypertension: I10

## 2015-06-26 HISTORY — DX: Polyneuropathy, unspecified: G62.9

## 2015-06-27 DIAGNOSIS — Z9889 Other specified postprocedural states: Secondary | ICD-10-CM | POA: Diagnosis not present

## 2015-06-27 DIAGNOSIS — I639 Cerebral infarction, unspecified: Secondary | ICD-10-CM

## 2015-06-27 DIAGNOSIS — I6310 Cerebral infarction due to embolism of unspecified precerebral artery: Secondary | ICD-10-CM | POA: Diagnosis not present

## 2015-06-27 DIAGNOSIS — I10 Essential (primary) hypertension: Secondary | ICD-10-CM | POA: Diagnosis not present

## 2015-06-27 DIAGNOSIS — Z01810 Encounter for preprocedural cardiovascular examination: Secondary | ICD-10-CM

## 2015-06-27 HISTORY — DX: Cerebral infarction, unspecified: I63.9

## 2015-06-27 HISTORY — DX: Encounter for preprocedural cardiovascular examination: Z01.810

## 2015-06-28 DIAGNOSIS — M810 Age-related osteoporosis without current pathological fracture: Secondary | ICD-10-CM | POA: Diagnosis not present

## 2015-06-28 DIAGNOSIS — K219 Gastro-esophageal reflux disease without esophagitis: Secondary | ICD-10-CM | POA: Diagnosis not present

## 2015-06-28 DIAGNOSIS — Z01810 Encounter for preprocedural cardiovascular examination: Secondary | ICD-10-CM | POA: Diagnosis not present

## 2015-06-28 DIAGNOSIS — N184 Chronic kidney disease, stage 4 (severe): Secondary | ICD-10-CM | POA: Diagnosis not present

## 2015-06-28 DIAGNOSIS — E7800 Pure hypercholesterolemia, unspecified: Secondary | ICD-10-CM | POA: Diagnosis not present

## 2015-06-28 DIAGNOSIS — M159 Polyosteoarthritis, unspecified: Secondary | ICD-10-CM | POA: Diagnosis not present

## 2015-06-28 DIAGNOSIS — R2681 Unsteadiness on feet: Secondary | ICD-10-CM | POA: Diagnosis not present

## 2015-06-28 DIAGNOSIS — E538 Deficiency of other specified B group vitamins: Secondary | ICD-10-CM | POA: Diagnosis not present

## 2015-06-28 DIAGNOSIS — R609 Edema, unspecified: Secondary | ICD-10-CM | POA: Diagnosis not present

## 2015-07-02 DIAGNOSIS — I361 Nonrheumatic tricuspid (valve) insufficiency: Secondary | ICD-10-CM | POA: Diagnosis not present

## 2015-07-16 DIAGNOSIS — M1711 Unilateral primary osteoarthritis, right knee: Secondary | ICD-10-CM | POA: Diagnosis not present

## 2015-07-24 DIAGNOSIS — N183 Chronic kidney disease, stage 3 (moderate): Secondary | ICD-10-CM | POA: Diagnosis not present

## 2015-07-24 DIAGNOSIS — Z471 Aftercare following joint replacement surgery: Secondary | ICD-10-CM | POA: Diagnosis not present

## 2015-07-24 DIAGNOSIS — M85861 Other specified disorders of bone density and structure, right lower leg: Secondary | ICD-10-CM | POA: Diagnosis not present

## 2015-07-24 DIAGNOSIS — I129 Hypertensive chronic kidney disease with stage 1 through stage 4 chronic kidney disease, or unspecified chronic kidney disease: Secondary | ICD-10-CM | POA: Diagnosis not present

## 2015-07-24 DIAGNOSIS — Z79899 Other long term (current) drug therapy: Secondary | ICD-10-CM | POA: Diagnosis not present

## 2015-07-24 DIAGNOSIS — I10 Essential (primary) hypertension: Secondary | ICD-10-CM | POA: Diagnosis not present

## 2015-07-24 DIAGNOSIS — Z96641 Presence of right artificial hip joint: Secondary | ICD-10-CM | POA: Diagnosis not present

## 2015-07-24 DIAGNOSIS — G8918 Other acute postprocedural pain: Secondary | ICD-10-CM | POA: Diagnosis not present

## 2015-07-24 DIAGNOSIS — D62 Acute posthemorrhagic anemia: Secondary | ICD-10-CM | POA: Diagnosis not present

## 2015-07-24 DIAGNOSIS — Z96651 Presence of right artificial knee joint: Secondary | ICD-10-CM | POA: Diagnosis not present

## 2015-07-24 DIAGNOSIS — Z7982 Long term (current) use of aspirin: Secondary | ICD-10-CM | POA: Diagnosis not present

## 2015-07-24 DIAGNOSIS — M1711 Unilateral primary osteoarthritis, right knee: Secondary | ICD-10-CM | POA: Diagnosis not present

## 2015-07-24 DIAGNOSIS — R338 Other retention of urine: Secondary | ICD-10-CM | POA: Diagnosis not present

## 2015-07-24 DIAGNOSIS — N179 Acute kidney failure, unspecified: Secondary | ICD-10-CM | POA: Diagnosis not present

## 2015-07-24 DIAGNOSIS — E785 Hyperlipidemia, unspecified: Secondary | ICD-10-CM | POA: Diagnosis not present

## 2015-07-28 DIAGNOSIS — Z471 Aftercare following joint replacement surgery: Secondary | ICD-10-CM | POA: Diagnosis not present

## 2015-07-28 DIAGNOSIS — G8918 Other acute postprocedural pain: Secondary | ICD-10-CM | POA: Diagnosis not present

## 2015-07-28 DIAGNOSIS — D649 Anemia, unspecified: Secondary | ICD-10-CM | POA: Diagnosis not present

## 2015-07-28 DIAGNOSIS — M1711 Unilateral primary osteoarthritis, right knee: Secondary | ICD-10-CM | POA: Diagnosis not present

## 2015-07-28 DIAGNOSIS — R338 Other retention of urine: Secondary | ICD-10-CM | POA: Diagnosis not present

## 2015-07-28 DIAGNOSIS — Z96651 Presence of right artificial knee joint: Secondary | ICD-10-CM | POA: Diagnosis not present

## 2015-07-28 DIAGNOSIS — R262 Difficulty in walking, not elsewhere classified: Secondary | ICD-10-CM | POA: Diagnosis not present

## 2015-07-29 DIAGNOSIS — G8918 Other acute postprocedural pain: Secondary | ICD-10-CM | POA: Diagnosis not present

## 2015-07-29 DIAGNOSIS — Z471 Aftercare following joint replacement surgery: Secondary | ICD-10-CM | POA: Diagnosis not present

## 2015-07-29 DIAGNOSIS — R262 Difficulty in walking, not elsewhere classified: Secondary | ICD-10-CM | POA: Diagnosis not present

## 2015-07-29 DIAGNOSIS — D649 Anemia, unspecified: Secondary | ICD-10-CM | POA: Diagnosis not present

## 2015-08-06 DIAGNOSIS — Z96651 Presence of right artificial knee joint: Secondary | ICD-10-CM | POA: Diagnosis not present

## 2015-08-09 DIAGNOSIS — I129 Hypertensive chronic kidney disease with stage 1 through stage 4 chronic kidney disease, or unspecified chronic kidney disease: Secondary | ICD-10-CM | POA: Diagnosis not present

## 2015-08-09 DIAGNOSIS — Z471 Aftercare following joint replacement surgery: Secondary | ICD-10-CM | POA: Diagnosis not present

## 2015-08-09 DIAGNOSIS — Z96651 Presence of right artificial knee joint: Secondary | ICD-10-CM | POA: Diagnosis not present

## 2015-08-09 DIAGNOSIS — N183 Chronic kidney disease, stage 3 (moderate): Secondary | ICD-10-CM | POA: Diagnosis not present

## 2015-08-09 DIAGNOSIS — Z7982 Long term (current) use of aspirin: Secondary | ICD-10-CM | POA: Diagnosis not present

## 2015-08-10 DIAGNOSIS — Z7982 Long term (current) use of aspirin: Secondary | ICD-10-CM | POA: Diagnosis not present

## 2015-08-10 DIAGNOSIS — Z471 Aftercare following joint replacement surgery: Secondary | ICD-10-CM | POA: Diagnosis not present

## 2015-08-10 DIAGNOSIS — Z96651 Presence of right artificial knee joint: Secondary | ICD-10-CM | POA: Diagnosis not present

## 2015-08-10 DIAGNOSIS — N183 Chronic kidney disease, stage 3 (moderate): Secondary | ICD-10-CM | POA: Diagnosis not present

## 2015-08-10 DIAGNOSIS — I129 Hypertensive chronic kidney disease with stage 1 through stage 4 chronic kidney disease, or unspecified chronic kidney disease: Secondary | ICD-10-CM | POA: Diagnosis not present

## 2015-08-12 DIAGNOSIS — Z471 Aftercare following joint replacement surgery: Secondary | ICD-10-CM | POA: Diagnosis not present

## 2015-08-12 DIAGNOSIS — N183 Chronic kidney disease, stage 3 (moderate): Secondary | ICD-10-CM | POA: Diagnosis not present

## 2015-08-12 DIAGNOSIS — Z96651 Presence of right artificial knee joint: Secondary | ICD-10-CM | POA: Diagnosis not present

## 2015-08-12 DIAGNOSIS — Z7982 Long term (current) use of aspirin: Secondary | ICD-10-CM | POA: Diagnosis not present

## 2015-08-12 DIAGNOSIS — I129 Hypertensive chronic kidney disease with stage 1 through stage 4 chronic kidney disease, or unspecified chronic kidney disease: Secondary | ICD-10-CM | POA: Diagnosis not present

## 2015-08-14 DIAGNOSIS — N183 Chronic kidney disease, stage 3 (moderate): Secondary | ICD-10-CM | POA: Diagnosis not present

## 2015-08-14 DIAGNOSIS — I129 Hypertensive chronic kidney disease with stage 1 through stage 4 chronic kidney disease, or unspecified chronic kidney disease: Secondary | ICD-10-CM | POA: Diagnosis not present

## 2015-08-14 DIAGNOSIS — Z7982 Long term (current) use of aspirin: Secondary | ICD-10-CM | POA: Diagnosis not present

## 2015-08-14 DIAGNOSIS — Z96651 Presence of right artificial knee joint: Secondary | ICD-10-CM | POA: Diagnosis not present

## 2015-08-14 DIAGNOSIS — Z471 Aftercare following joint replacement surgery: Secondary | ICD-10-CM | POA: Diagnosis not present

## 2015-08-15 DIAGNOSIS — I129 Hypertensive chronic kidney disease with stage 1 through stage 4 chronic kidney disease, or unspecified chronic kidney disease: Secondary | ICD-10-CM | POA: Diagnosis not present

## 2015-08-15 DIAGNOSIS — Z96651 Presence of right artificial knee joint: Secondary | ICD-10-CM | POA: Diagnosis not present

## 2015-08-15 DIAGNOSIS — Z7982 Long term (current) use of aspirin: Secondary | ICD-10-CM | POA: Diagnosis not present

## 2015-08-15 DIAGNOSIS — N183 Chronic kidney disease, stage 3 (moderate): Secondary | ICD-10-CM | POA: Diagnosis not present

## 2015-08-15 DIAGNOSIS — Z471 Aftercare following joint replacement surgery: Secondary | ICD-10-CM | POA: Diagnosis not present

## 2015-08-16 DIAGNOSIS — Z7982 Long term (current) use of aspirin: Secondary | ICD-10-CM | POA: Diagnosis not present

## 2015-08-16 DIAGNOSIS — Z471 Aftercare following joint replacement surgery: Secondary | ICD-10-CM | POA: Diagnosis not present

## 2015-08-16 DIAGNOSIS — I129 Hypertensive chronic kidney disease with stage 1 through stage 4 chronic kidney disease, or unspecified chronic kidney disease: Secondary | ICD-10-CM | POA: Diagnosis not present

## 2015-08-16 DIAGNOSIS — N183 Chronic kidney disease, stage 3 (moderate): Secondary | ICD-10-CM | POA: Diagnosis not present

## 2015-08-16 DIAGNOSIS — Z96651 Presence of right artificial knee joint: Secondary | ICD-10-CM | POA: Diagnosis not present

## 2015-08-19 DIAGNOSIS — Z471 Aftercare following joint replacement surgery: Secondary | ICD-10-CM | POA: Diagnosis not present

## 2015-08-19 DIAGNOSIS — N183 Chronic kidney disease, stage 3 (moderate): Secondary | ICD-10-CM | POA: Diagnosis not present

## 2015-08-19 DIAGNOSIS — I129 Hypertensive chronic kidney disease with stage 1 through stage 4 chronic kidney disease, or unspecified chronic kidney disease: Secondary | ICD-10-CM | POA: Diagnosis not present

## 2015-08-19 DIAGNOSIS — Z96651 Presence of right artificial knee joint: Secondary | ICD-10-CM | POA: Diagnosis not present

## 2015-08-19 DIAGNOSIS — Z7982 Long term (current) use of aspirin: Secondary | ICD-10-CM | POA: Diagnosis not present

## 2015-08-20 DIAGNOSIS — I129 Hypertensive chronic kidney disease with stage 1 through stage 4 chronic kidney disease, or unspecified chronic kidney disease: Secondary | ICD-10-CM | POA: Diagnosis not present

## 2015-08-20 DIAGNOSIS — Z96651 Presence of right artificial knee joint: Secondary | ICD-10-CM | POA: Diagnosis not present

## 2015-08-20 DIAGNOSIS — N183 Chronic kidney disease, stage 3 (moderate): Secondary | ICD-10-CM | POA: Diagnosis not present

## 2015-08-20 DIAGNOSIS — Z471 Aftercare following joint replacement surgery: Secondary | ICD-10-CM | POA: Diagnosis not present

## 2015-08-20 DIAGNOSIS — Z7982 Long term (current) use of aspirin: Secondary | ICD-10-CM | POA: Diagnosis not present

## 2015-08-21 DIAGNOSIS — N183 Chronic kidney disease, stage 3 (moderate): Secondary | ICD-10-CM | POA: Diagnosis not present

## 2015-08-21 DIAGNOSIS — Z471 Aftercare following joint replacement surgery: Secondary | ICD-10-CM | POA: Diagnosis not present

## 2015-08-21 DIAGNOSIS — I129 Hypertensive chronic kidney disease with stage 1 through stage 4 chronic kidney disease, or unspecified chronic kidney disease: Secondary | ICD-10-CM | POA: Diagnosis not present

## 2015-08-21 DIAGNOSIS — Z7982 Long term (current) use of aspirin: Secondary | ICD-10-CM | POA: Diagnosis not present

## 2015-08-21 DIAGNOSIS — Z96651 Presence of right artificial knee joint: Secondary | ICD-10-CM | POA: Diagnosis not present

## 2015-08-22 DIAGNOSIS — N183 Chronic kidney disease, stage 3 (moderate): Secondary | ICD-10-CM | POA: Diagnosis not present

## 2015-08-22 DIAGNOSIS — E539 Vitamin B deficiency, unspecified: Secondary | ICD-10-CM | POA: Diagnosis not present

## 2015-08-22 DIAGNOSIS — I10 Essential (primary) hypertension: Secondary | ICD-10-CM | POA: Diagnosis not present

## 2015-08-22 DIAGNOSIS — E7800 Pure hypercholesterolemia, unspecified: Secondary | ICD-10-CM | POA: Diagnosis not present

## 2015-08-22 DIAGNOSIS — R609 Edema, unspecified: Secondary | ICD-10-CM | POA: Diagnosis not present

## 2015-08-22 DIAGNOSIS — Z7982 Long term (current) use of aspirin: Secondary | ICD-10-CM | POA: Diagnosis not present

## 2015-08-22 DIAGNOSIS — Z471 Aftercare following joint replacement surgery: Secondary | ICD-10-CM | POA: Diagnosis not present

## 2015-08-22 DIAGNOSIS — I129 Hypertensive chronic kidney disease with stage 1 through stage 4 chronic kidney disease, or unspecified chronic kidney disease: Secondary | ICD-10-CM | POA: Diagnosis not present

## 2015-08-22 DIAGNOSIS — M810 Age-related osteoporosis without current pathological fracture: Secondary | ICD-10-CM | POA: Diagnosis not present

## 2015-08-22 DIAGNOSIS — Z96651 Presence of right artificial knee joint: Secondary | ICD-10-CM | POA: Diagnosis not present

## 2015-08-22 DIAGNOSIS — K219 Gastro-esophageal reflux disease without esophagitis: Secondary | ICD-10-CM | POA: Diagnosis not present

## 2015-08-22 DIAGNOSIS — N184 Chronic kidney disease, stage 4 (severe): Secondary | ICD-10-CM | POA: Diagnosis not present

## 2015-08-22 DIAGNOSIS — M159 Polyosteoarthritis, unspecified: Secondary | ICD-10-CM | POA: Diagnosis not present

## 2015-08-22 DIAGNOSIS — D649 Anemia, unspecified: Secondary | ICD-10-CM | POA: Diagnosis not present

## 2015-08-23 DIAGNOSIS — Z96651 Presence of right artificial knee joint: Secondary | ICD-10-CM | POA: Diagnosis not present

## 2015-08-23 DIAGNOSIS — Z471 Aftercare following joint replacement surgery: Secondary | ICD-10-CM | POA: Diagnosis not present

## 2015-08-23 DIAGNOSIS — Z7982 Long term (current) use of aspirin: Secondary | ICD-10-CM | POA: Diagnosis not present

## 2015-08-23 DIAGNOSIS — N183 Chronic kidney disease, stage 3 (moderate): Secondary | ICD-10-CM | POA: Diagnosis not present

## 2015-08-23 DIAGNOSIS — I129 Hypertensive chronic kidney disease with stage 1 through stage 4 chronic kidney disease, or unspecified chronic kidney disease: Secondary | ICD-10-CM | POA: Diagnosis not present

## 2015-08-26 DIAGNOSIS — I129 Hypertensive chronic kidney disease with stage 1 through stage 4 chronic kidney disease, or unspecified chronic kidney disease: Secondary | ICD-10-CM | POA: Diagnosis not present

## 2015-08-26 DIAGNOSIS — Z471 Aftercare following joint replacement surgery: Secondary | ICD-10-CM | POA: Diagnosis not present

## 2015-08-26 DIAGNOSIS — N183 Chronic kidney disease, stage 3 (moderate): Secondary | ICD-10-CM | POA: Diagnosis not present

## 2015-08-26 DIAGNOSIS — Z96651 Presence of right artificial knee joint: Secondary | ICD-10-CM | POA: Diagnosis not present

## 2015-08-26 DIAGNOSIS — Z7982 Long term (current) use of aspirin: Secondary | ICD-10-CM | POA: Diagnosis not present

## 2015-08-27 DIAGNOSIS — Z7982 Long term (current) use of aspirin: Secondary | ICD-10-CM | POA: Diagnosis not present

## 2015-08-27 DIAGNOSIS — Z471 Aftercare following joint replacement surgery: Secondary | ICD-10-CM | POA: Diagnosis not present

## 2015-08-27 DIAGNOSIS — Z96651 Presence of right artificial knee joint: Secondary | ICD-10-CM | POA: Diagnosis not present

## 2015-08-27 DIAGNOSIS — N183 Chronic kidney disease, stage 3 (moderate): Secondary | ICD-10-CM | POA: Diagnosis not present

## 2015-08-27 DIAGNOSIS — I129 Hypertensive chronic kidney disease with stage 1 through stage 4 chronic kidney disease, or unspecified chronic kidney disease: Secondary | ICD-10-CM | POA: Diagnosis not present

## 2015-08-28 DIAGNOSIS — Z7982 Long term (current) use of aspirin: Secondary | ICD-10-CM | POA: Diagnosis not present

## 2015-08-28 DIAGNOSIS — I129 Hypertensive chronic kidney disease with stage 1 through stage 4 chronic kidney disease, or unspecified chronic kidney disease: Secondary | ICD-10-CM | POA: Diagnosis not present

## 2015-08-28 DIAGNOSIS — Z471 Aftercare following joint replacement surgery: Secondary | ICD-10-CM | POA: Diagnosis not present

## 2015-08-28 DIAGNOSIS — Z96651 Presence of right artificial knee joint: Secondary | ICD-10-CM | POA: Diagnosis not present

## 2015-08-28 DIAGNOSIS — N183 Chronic kidney disease, stage 3 (moderate): Secondary | ICD-10-CM | POA: Diagnosis not present

## 2015-08-29 DIAGNOSIS — I129 Hypertensive chronic kidney disease with stage 1 through stage 4 chronic kidney disease, or unspecified chronic kidney disease: Secondary | ICD-10-CM | POA: Diagnosis not present

## 2015-08-29 DIAGNOSIS — B961 Klebsiella pneumoniae [K. pneumoniae] as the cause of diseases classified elsewhere: Secondary | ICD-10-CM | POA: Diagnosis not present

## 2015-08-29 DIAGNOSIS — I4891 Unspecified atrial fibrillation: Secondary | ICD-10-CM

## 2015-08-29 DIAGNOSIS — R079 Chest pain, unspecified: Secondary | ICD-10-CM | POA: Diagnosis not present

## 2015-08-29 DIAGNOSIS — R531 Weakness: Secondary | ICD-10-CM | POA: Diagnosis not present

## 2015-08-29 DIAGNOSIS — E860 Dehydration: Secondary | ICD-10-CM | POA: Diagnosis not present

## 2015-08-29 DIAGNOSIS — G459 Transient cerebral ischemic attack, unspecified: Secondary | ICD-10-CM | POA: Diagnosis not present

## 2015-08-29 DIAGNOSIS — R4781 Slurred speech: Secondary | ICD-10-CM | POA: Diagnosis not present

## 2015-08-29 DIAGNOSIS — Z96651 Presence of right artificial knee joint: Secondary | ICD-10-CM | POA: Diagnosis not present

## 2015-08-29 DIAGNOSIS — R0602 Shortness of breath: Secondary | ICD-10-CM | POA: Diagnosis not present

## 2015-08-29 DIAGNOSIS — Z96659 Presence of unspecified artificial knee joint: Secondary | ICD-10-CM | POA: Diagnosis not present

## 2015-08-29 DIAGNOSIS — N183 Chronic kidney disease, stage 3 (moderate): Secondary | ICD-10-CM | POA: Diagnosis not present

## 2015-08-29 DIAGNOSIS — N390 Urinary tract infection, site not specified: Secondary | ICD-10-CM | POA: Diagnosis not present

## 2015-08-29 DIAGNOSIS — Z66 Do not resuscitate: Secondary | ICD-10-CM | POA: Diagnosis not present

## 2015-08-29 DIAGNOSIS — I482 Chronic atrial fibrillation: Secondary | ICD-10-CM | POA: Diagnosis not present

## 2015-08-29 DIAGNOSIS — N3000 Acute cystitis without hematuria: Secondary | ICD-10-CM | POA: Diagnosis not present

## 2015-08-29 DIAGNOSIS — B9620 Unspecified Escherichia coli [E. coli] as the cause of diseases classified elsewhere: Secondary | ICD-10-CM | POA: Diagnosis not present

## 2015-08-30 DIAGNOSIS — Z96659 Presence of unspecified artificial knee joint: Secondary | ICD-10-CM | POA: Diagnosis not present

## 2015-08-30 DIAGNOSIS — Z66 Do not resuscitate: Secondary | ICD-10-CM | POA: Diagnosis not present

## 2015-08-30 DIAGNOSIS — N390 Urinary tract infection, site not specified: Secondary | ICD-10-CM | POA: Diagnosis not present

## 2015-08-30 DIAGNOSIS — N183 Chronic kidney disease, stage 3 (moderate): Secondary | ICD-10-CM | POA: Diagnosis not present

## 2015-08-31 DIAGNOSIS — N3090 Cystitis, unspecified without hematuria: Secondary | ICD-10-CM | POA: Diagnosis not present

## 2015-08-31 DIAGNOSIS — N183 Chronic kidney disease, stage 3 (moderate): Secondary | ICD-10-CM | POA: Diagnosis not present

## 2015-08-31 DIAGNOSIS — N390 Urinary tract infection, site not specified: Secondary | ICD-10-CM | POA: Diagnosis not present

## 2015-09-02 DIAGNOSIS — Z471 Aftercare following joint replacement surgery: Secondary | ICD-10-CM | POA: Diagnosis not present

## 2015-09-02 DIAGNOSIS — N183 Chronic kidney disease, stage 3 (moderate): Secondary | ICD-10-CM | POA: Diagnosis not present

## 2015-09-02 DIAGNOSIS — Z96651 Presence of right artificial knee joint: Secondary | ICD-10-CM | POA: Diagnosis not present

## 2015-09-02 DIAGNOSIS — I129 Hypertensive chronic kidney disease with stage 1 through stage 4 chronic kidney disease, or unspecified chronic kidney disease: Secondary | ICD-10-CM | POA: Diagnosis not present

## 2015-09-02 DIAGNOSIS — Z7982 Long term (current) use of aspirin: Secondary | ICD-10-CM | POA: Diagnosis not present

## 2015-09-03 DIAGNOSIS — I129 Hypertensive chronic kidney disease with stage 1 through stage 4 chronic kidney disease, or unspecified chronic kidney disease: Secondary | ICD-10-CM | POA: Diagnosis not present

## 2015-09-03 DIAGNOSIS — N183 Chronic kidney disease, stage 3 (moderate): Secondary | ICD-10-CM | POA: Diagnosis not present

## 2015-09-03 DIAGNOSIS — Z96651 Presence of right artificial knee joint: Secondary | ICD-10-CM | POA: Diagnosis not present

## 2015-09-03 DIAGNOSIS — Z471 Aftercare following joint replacement surgery: Secondary | ICD-10-CM | POA: Diagnosis not present

## 2015-09-03 DIAGNOSIS — Z7982 Long term (current) use of aspirin: Secondary | ICD-10-CM | POA: Diagnosis not present

## 2015-09-04 DIAGNOSIS — I129 Hypertensive chronic kidney disease with stage 1 through stage 4 chronic kidney disease, or unspecified chronic kidney disease: Secondary | ICD-10-CM | POA: Diagnosis not present

## 2015-09-04 DIAGNOSIS — N183 Chronic kidney disease, stage 3 (moderate): Secondary | ICD-10-CM | POA: Diagnosis not present

## 2015-09-04 DIAGNOSIS — Z96651 Presence of right artificial knee joint: Secondary | ICD-10-CM | POA: Diagnosis not present

## 2015-09-04 DIAGNOSIS — Z7982 Long term (current) use of aspirin: Secondary | ICD-10-CM | POA: Diagnosis not present

## 2015-09-04 DIAGNOSIS — Z471 Aftercare following joint replacement surgery: Secondary | ICD-10-CM | POA: Diagnosis not present

## 2015-09-06 DIAGNOSIS — N183 Chronic kidney disease, stage 3 (moderate): Secondary | ICD-10-CM | POA: Diagnosis not present

## 2015-09-06 DIAGNOSIS — I129 Hypertensive chronic kidney disease with stage 1 through stage 4 chronic kidney disease, or unspecified chronic kidney disease: Secondary | ICD-10-CM | POA: Diagnosis not present

## 2015-09-06 DIAGNOSIS — Z471 Aftercare following joint replacement surgery: Secondary | ICD-10-CM | POA: Diagnosis not present

## 2015-09-06 DIAGNOSIS — Z7982 Long term (current) use of aspirin: Secondary | ICD-10-CM | POA: Diagnosis not present

## 2015-09-06 DIAGNOSIS — Z96651 Presence of right artificial knee joint: Secondary | ICD-10-CM | POA: Diagnosis not present

## 2015-09-09 DIAGNOSIS — Z96651 Presence of right artificial knee joint: Secondary | ICD-10-CM | POA: Diagnosis not present

## 2015-09-09 DIAGNOSIS — Z471 Aftercare following joint replacement surgery: Secondary | ICD-10-CM | POA: Diagnosis not present

## 2015-09-09 DIAGNOSIS — Z7982 Long term (current) use of aspirin: Secondary | ICD-10-CM | POA: Diagnosis not present

## 2015-09-09 DIAGNOSIS — N183 Chronic kidney disease, stage 3 (moderate): Secondary | ICD-10-CM | POA: Diagnosis not present

## 2015-09-09 DIAGNOSIS — I129 Hypertensive chronic kidney disease with stage 1 through stage 4 chronic kidney disease, or unspecified chronic kidney disease: Secondary | ICD-10-CM | POA: Diagnosis not present

## 2015-09-10 DIAGNOSIS — Z79899 Other long term (current) drug therapy: Secondary | ICD-10-CM | POA: Diagnosis not present

## 2015-09-10 DIAGNOSIS — K319 Disease of stomach and duodenum, unspecified: Secondary | ICD-10-CM | POA: Diagnosis not present

## 2015-09-10 DIAGNOSIS — M810 Age-related osteoporosis without current pathological fracture: Secondary | ICD-10-CM | POA: Diagnosis not present

## 2015-09-10 DIAGNOSIS — Z7982 Long term (current) use of aspirin: Secondary | ICD-10-CM | POA: Diagnosis not present

## 2015-09-10 DIAGNOSIS — N183 Chronic kidney disease, stage 3 (moderate): Secondary | ICD-10-CM | POA: Diagnosis not present

## 2015-09-10 DIAGNOSIS — I639 Cerebral infarction, unspecified: Secondary | ICD-10-CM | POA: Diagnosis not present

## 2015-09-10 DIAGNOSIS — Z96651 Presence of right artificial knee joint: Secondary | ICD-10-CM | POA: Diagnosis not present

## 2015-09-10 DIAGNOSIS — E538 Deficiency of other specified B group vitamins: Secondary | ICD-10-CM | POA: Diagnosis not present

## 2015-09-10 DIAGNOSIS — I10 Essential (primary) hypertension: Secondary | ICD-10-CM | POA: Diagnosis not present

## 2015-09-10 DIAGNOSIS — R2681 Unsteadiness on feet: Secondary | ICD-10-CM | POA: Diagnosis not present

## 2015-09-10 DIAGNOSIS — I129 Hypertensive chronic kidney disease with stage 1 through stage 4 chronic kidney disease, or unspecified chronic kidney disease: Secondary | ICD-10-CM | POA: Diagnosis not present

## 2015-09-10 DIAGNOSIS — R609 Edema, unspecified: Secondary | ICD-10-CM | POA: Diagnosis not present

## 2015-09-10 DIAGNOSIS — E7800 Pure hypercholesterolemia, unspecified: Secondary | ICD-10-CM | POA: Diagnosis not present

## 2015-09-10 DIAGNOSIS — N184 Chronic kidney disease, stage 4 (severe): Secondary | ICD-10-CM | POA: Diagnosis not present

## 2015-09-10 DIAGNOSIS — Z471 Aftercare following joint replacement surgery: Secondary | ICD-10-CM | POA: Diagnosis not present

## 2015-09-11 DIAGNOSIS — I129 Hypertensive chronic kidney disease with stage 1 through stage 4 chronic kidney disease, or unspecified chronic kidney disease: Secondary | ICD-10-CM | POA: Diagnosis not present

## 2015-09-11 DIAGNOSIS — Z96651 Presence of right artificial knee joint: Secondary | ICD-10-CM | POA: Diagnosis not present

## 2015-09-11 DIAGNOSIS — Z7982 Long term (current) use of aspirin: Secondary | ICD-10-CM | POA: Diagnosis not present

## 2015-09-11 DIAGNOSIS — Z471 Aftercare following joint replacement surgery: Secondary | ICD-10-CM | POA: Diagnosis not present

## 2015-09-11 DIAGNOSIS — M6281 Muscle weakness (generalized): Secondary | ICD-10-CM | POA: Diagnosis not present

## 2015-09-11 DIAGNOSIS — N183 Chronic kidney disease, stage 3 (moderate): Secondary | ICD-10-CM | POA: Diagnosis not present

## 2015-09-11 DIAGNOSIS — M25561 Pain in right knee: Secondary | ICD-10-CM | POA: Diagnosis not present

## 2015-09-12 DIAGNOSIS — Z96651 Presence of right artificial knee joint: Secondary | ICD-10-CM | POA: Diagnosis not present

## 2015-09-12 DIAGNOSIS — N183 Chronic kidney disease, stage 3 (moderate): Secondary | ICD-10-CM | POA: Diagnosis not present

## 2015-09-12 DIAGNOSIS — I129 Hypertensive chronic kidney disease with stage 1 through stage 4 chronic kidney disease, or unspecified chronic kidney disease: Secondary | ICD-10-CM | POA: Diagnosis not present

## 2015-09-12 DIAGNOSIS — Z471 Aftercare following joint replacement surgery: Secondary | ICD-10-CM | POA: Diagnosis not present

## 2015-09-12 DIAGNOSIS — Z7982 Long term (current) use of aspirin: Secondary | ICD-10-CM | POA: Diagnosis not present

## 2015-09-13 DIAGNOSIS — Z96651 Presence of right artificial knee joint: Secondary | ICD-10-CM | POA: Diagnosis not present

## 2015-09-13 DIAGNOSIS — N183 Chronic kidney disease, stage 3 (moderate): Secondary | ICD-10-CM | POA: Diagnosis not present

## 2015-09-13 DIAGNOSIS — Z471 Aftercare following joint replacement surgery: Secondary | ICD-10-CM | POA: Diagnosis not present

## 2015-09-13 DIAGNOSIS — I129 Hypertensive chronic kidney disease with stage 1 through stage 4 chronic kidney disease, or unspecified chronic kidney disease: Secondary | ICD-10-CM | POA: Diagnosis not present

## 2015-09-13 DIAGNOSIS — Z7982 Long term (current) use of aspirin: Secondary | ICD-10-CM | POA: Diagnosis not present

## 2015-09-24 DIAGNOSIS — I639 Cerebral infarction, unspecified: Secondary | ICD-10-CM | POA: Diagnosis not present

## 2015-09-24 DIAGNOSIS — E7800 Pure hypercholesterolemia, unspecified: Secondary | ICD-10-CM | POA: Diagnosis not present

## 2015-09-24 DIAGNOSIS — N184 Chronic kidney disease, stage 4 (severe): Secondary | ICD-10-CM | POA: Diagnosis not present

## 2015-09-24 DIAGNOSIS — M810 Age-related osteoporosis without current pathological fracture: Secondary | ICD-10-CM | POA: Diagnosis not present

## 2015-09-24 DIAGNOSIS — R609 Edema, unspecified: Secondary | ICD-10-CM | POA: Diagnosis not present

## 2015-09-24 DIAGNOSIS — E538 Deficiency of other specified B group vitamins: Secondary | ICD-10-CM | POA: Diagnosis not present

## 2015-09-24 DIAGNOSIS — K219 Gastro-esophageal reflux disease without esophagitis: Secondary | ICD-10-CM | POA: Diagnosis not present

## 2015-09-24 DIAGNOSIS — M159 Polyosteoarthritis, unspecified: Secondary | ICD-10-CM | POA: Diagnosis not present

## 2015-09-24 DIAGNOSIS — R2681 Unsteadiness on feet: Secondary | ICD-10-CM | POA: Diagnosis not present

## 2015-10-03 DIAGNOSIS — R609 Edema, unspecified: Secondary | ICD-10-CM | POA: Diagnosis not present

## 2015-10-03 DIAGNOSIS — N184 Chronic kidney disease, stage 4 (severe): Secondary | ICD-10-CM | POA: Diagnosis not present

## 2015-10-03 DIAGNOSIS — I639 Cerebral infarction, unspecified: Secondary | ICD-10-CM | POA: Diagnosis not present

## 2015-10-03 DIAGNOSIS — E7800 Pure hypercholesterolemia, unspecified: Secondary | ICD-10-CM | POA: Diagnosis not present

## 2015-10-03 DIAGNOSIS — N390 Urinary tract infection, site not specified: Secondary | ICD-10-CM | POA: Diagnosis not present

## 2015-10-03 DIAGNOSIS — K219 Gastro-esophageal reflux disease without esophagitis: Secondary | ICD-10-CM | POA: Diagnosis not present

## 2015-10-03 DIAGNOSIS — E538 Deficiency of other specified B group vitamins: Secondary | ICD-10-CM | POA: Diagnosis not present

## 2015-10-03 DIAGNOSIS — R636 Underweight: Secondary | ICD-10-CM | POA: Diagnosis not present

## 2015-10-03 DIAGNOSIS — Z681 Body mass index (BMI) 19 or less, adult: Secondary | ICD-10-CM | POA: Diagnosis not present

## 2015-10-04 DIAGNOSIS — N179 Acute kidney failure, unspecified: Secondary | ICD-10-CM | POA: Diagnosis not present

## 2015-10-04 DIAGNOSIS — R918 Other nonspecific abnormal finding of lung field: Secondary | ICD-10-CM | POA: Diagnosis not present

## 2015-10-04 DIAGNOSIS — R748 Abnormal levels of other serum enzymes: Secondary | ICD-10-CM | POA: Diagnosis not present

## 2015-10-04 DIAGNOSIS — R319 Hematuria, unspecified: Secondary | ICD-10-CM | POA: Diagnosis not present

## 2015-10-04 DIAGNOSIS — R0602 Shortness of breath: Secondary | ICD-10-CM | POA: Diagnosis not present

## 2015-10-04 DIAGNOSIS — N189 Chronic kidney disease, unspecified: Secondary | ICD-10-CM | POA: Diagnosis not present

## 2015-10-04 DIAGNOSIS — R109 Unspecified abdominal pain: Secondary | ICD-10-CM | POA: Diagnosis not present

## 2015-10-04 DIAGNOSIS — R531 Weakness: Secondary | ICD-10-CM | POA: Diagnosis not present

## 2015-10-04 DIAGNOSIS — E860 Dehydration: Secondary | ICD-10-CM | POA: Diagnosis not present

## 2015-10-07 DIAGNOSIS — M159 Polyosteoarthritis, unspecified: Secondary | ICD-10-CM | POA: Diagnosis not present

## 2015-10-07 DIAGNOSIS — M810 Age-related osteoporosis without current pathological fracture: Secondary | ICD-10-CM | POA: Diagnosis not present

## 2015-10-07 DIAGNOSIS — K219 Gastro-esophageal reflux disease without esophagitis: Secondary | ICD-10-CM | POA: Diagnosis not present

## 2015-10-07 DIAGNOSIS — R319 Hematuria, unspecified: Secondary | ICD-10-CM | POA: Diagnosis not present

## 2015-10-07 DIAGNOSIS — I10 Essential (primary) hypertension: Secondary | ICD-10-CM | POA: Diagnosis not present

## 2015-10-07 DIAGNOSIS — R7989 Other specified abnormal findings of blood chemistry: Secondary | ICD-10-CM | POA: Diagnosis not present

## 2015-10-07 DIAGNOSIS — E538 Deficiency of other specified B group vitamins: Secondary | ICD-10-CM | POA: Diagnosis not present

## 2015-10-07 DIAGNOSIS — E7800 Pure hypercholesterolemia, unspecified: Secondary | ICD-10-CM | POA: Diagnosis not present

## 2015-10-07 DIAGNOSIS — D649 Anemia, unspecified: Secondary | ICD-10-CM | POA: Diagnosis not present

## 2015-10-08 DIAGNOSIS — N3289 Other specified disorders of bladder: Secondary | ICD-10-CM | POA: Diagnosis not present

## 2015-10-08 DIAGNOSIS — K759 Inflammatory liver disease, unspecified: Secondary | ICD-10-CM | POA: Diagnosis not present

## 2015-10-08 DIAGNOSIS — R310 Gross hematuria: Secondary | ICD-10-CM | POA: Diagnosis not present

## 2015-10-08 DIAGNOSIS — N3090 Cystitis, unspecified without hematuria: Secondary | ICD-10-CM | POA: Diagnosis not present

## 2015-10-14 DIAGNOSIS — K759 Inflammatory liver disease, unspecified: Secondary | ICD-10-CM | POA: Diagnosis not present

## 2015-10-17 DIAGNOSIS — N390 Urinary tract infection, site not specified: Secondary | ICD-10-CM | POA: Diagnosis not present

## 2015-10-17 DIAGNOSIS — Z681 Body mass index (BMI) 19 or less, adult: Secondary | ICD-10-CM | POA: Diagnosis not present

## 2015-10-17 DIAGNOSIS — Z79899 Other long term (current) drug therapy: Secondary | ICD-10-CM | POA: Diagnosis not present

## 2015-10-17 DIAGNOSIS — R270 Ataxia, unspecified: Secondary | ICD-10-CM | POA: Diagnosis not present

## 2015-10-17 DIAGNOSIS — R5382 Chronic fatigue, unspecified: Secondary | ICD-10-CM | POA: Diagnosis not present

## 2015-10-17 DIAGNOSIS — E538 Deficiency of other specified B group vitamins: Secondary | ICD-10-CM | POA: Diagnosis not present

## 2015-10-17 DIAGNOSIS — R7989 Other specified abnormal findings of blood chemistry: Secondary | ICD-10-CM | POA: Diagnosis not present

## 2015-10-17 DIAGNOSIS — L089 Local infection of the skin and subcutaneous tissue, unspecified: Secondary | ICD-10-CM | POA: Diagnosis not present

## 2015-10-17 DIAGNOSIS — F419 Anxiety disorder, unspecified: Secondary | ICD-10-CM | POA: Diagnosis not present

## 2015-10-22 DIAGNOSIS — K716 Toxic liver disease with hepatitis, not elsewhere classified: Secondary | ICD-10-CM | POA: Diagnosis not present

## 2015-10-23 DIAGNOSIS — N3289 Other specified disorders of bladder: Secondary | ICD-10-CM | POA: Diagnosis not present

## 2015-10-23 DIAGNOSIS — I129 Hypertensive chronic kidney disease with stage 1 through stage 4 chronic kidney disease, or unspecified chronic kidney disease: Secondary | ICD-10-CM | POA: Diagnosis not present

## 2015-10-23 DIAGNOSIS — N390 Urinary tract infection, site not specified: Secondary | ICD-10-CM | POA: Diagnosis not present

## 2015-10-23 DIAGNOSIS — N184 Chronic kidney disease, stage 4 (severe): Secondary | ICD-10-CM | POA: Diagnosis not present

## 2015-10-23 DIAGNOSIS — M6281 Muscle weakness (generalized): Secondary | ICD-10-CM | POA: Diagnosis not present

## 2015-10-23 DIAGNOSIS — K219 Gastro-esophageal reflux disease without esophagitis: Secondary | ICD-10-CM | POA: Diagnosis not present

## 2015-10-23 DIAGNOSIS — R310 Gross hematuria: Secondary | ICD-10-CM | POA: Diagnosis not present

## 2015-10-23 DIAGNOSIS — R2681 Unsteadiness on feet: Secondary | ICD-10-CM | POA: Diagnosis not present

## 2015-10-23 DIAGNOSIS — Z9181 History of falling: Secondary | ICD-10-CM | POA: Diagnosis not present

## 2015-10-24 DIAGNOSIS — I129 Hypertensive chronic kidney disease with stage 1 through stage 4 chronic kidney disease, or unspecified chronic kidney disease: Secondary | ICD-10-CM | POA: Diagnosis not present

## 2015-10-24 DIAGNOSIS — M6281 Muscle weakness (generalized): Secondary | ICD-10-CM | POA: Diagnosis not present

## 2015-10-24 DIAGNOSIS — R2681 Unsteadiness on feet: Secondary | ICD-10-CM | POA: Diagnosis not present

## 2015-10-24 DIAGNOSIS — N184 Chronic kidney disease, stage 4 (severe): Secondary | ICD-10-CM | POA: Diagnosis not present

## 2015-10-24 DIAGNOSIS — Z9181 History of falling: Secondary | ICD-10-CM | POA: Diagnosis not present

## 2015-10-24 DIAGNOSIS — K219 Gastro-esophageal reflux disease without esophagitis: Secondary | ICD-10-CM | POA: Diagnosis not present

## 2015-10-24 DIAGNOSIS — N390 Urinary tract infection, site not specified: Secondary | ICD-10-CM | POA: Diagnosis not present

## 2015-10-25 DIAGNOSIS — I129 Hypertensive chronic kidney disease with stage 1 through stage 4 chronic kidney disease, or unspecified chronic kidney disease: Secondary | ICD-10-CM | POA: Diagnosis not present

## 2015-10-25 DIAGNOSIS — N184 Chronic kidney disease, stage 4 (severe): Secondary | ICD-10-CM | POA: Diagnosis not present

## 2015-10-25 DIAGNOSIS — R2681 Unsteadiness on feet: Secondary | ICD-10-CM | POA: Diagnosis not present

## 2015-10-25 DIAGNOSIS — N390 Urinary tract infection, site not specified: Secondary | ICD-10-CM | POA: Diagnosis not present

## 2015-10-25 DIAGNOSIS — Z9181 History of falling: Secondary | ICD-10-CM | POA: Diagnosis not present

## 2015-10-25 DIAGNOSIS — K219 Gastro-esophageal reflux disease without esophagitis: Secondary | ICD-10-CM | POA: Diagnosis not present

## 2015-10-25 DIAGNOSIS — M6281 Muscle weakness (generalized): Secondary | ICD-10-CM | POA: Diagnosis not present

## 2015-10-28 DIAGNOSIS — M6281 Muscle weakness (generalized): Secondary | ICD-10-CM | POA: Diagnosis not present

## 2015-10-28 DIAGNOSIS — N184 Chronic kidney disease, stage 4 (severe): Secondary | ICD-10-CM | POA: Diagnosis not present

## 2015-10-28 DIAGNOSIS — I129 Hypertensive chronic kidney disease with stage 1 through stage 4 chronic kidney disease, or unspecified chronic kidney disease: Secondary | ICD-10-CM | POA: Diagnosis not present

## 2015-10-28 DIAGNOSIS — N390 Urinary tract infection, site not specified: Secondary | ICD-10-CM | POA: Diagnosis not present

## 2015-10-28 DIAGNOSIS — Z9181 History of falling: Secondary | ICD-10-CM | POA: Diagnosis not present

## 2015-10-28 DIAGNOSIS — R2681 Unsteadiness on feet: Secondary | ICD-10-CM | POA: Diagnosis not present

## 2015-10-28 DIAGNOSIS — K219 Gastro-esophageal reflux disease without esophagitis: Secondary | ICD-10-CM | POA: Diagnosis not present

## 2015-10-29 DIAGNOSIS — Z9181 History of falling: Secondary | ICD-10-CM | POA: Diagnosis not present

## 2015-10-29 DIAGNOSIS — K219 Gastro-esophageal reflux disease without esophagitis: Secondary | ICD-10-CM | POA: Diagnosis not present

## 2015-10-29 DIAGNOSIS — N184 Chronic kidney disease, stage 4 (severe): Secondary | ICD-10-CM | POA: Diagnosis not present

## 2015-10-29 DIAGNOSIS — N390 Urinary tract infection, site not specified: Secondary | ICD-10-CM | POA: Diagnosis not present

## 2015-10-29 DIAGNOSIS — R2681 Unsteadiness on feet: Secondary | ICD-10-CM | POA: Diagnosis not present

## 2015-10-29 DIAGNOSIS — I129 Hypertensive chronic kidney disease with stage 1 through stage 4 chronic kidney disease, or unspecified chronic kidney disease: Secondary | ICD-10-CM | POA: Diagnosis not present

## 2015-10-29 DIAGNOSIS — M6281 Muscle weakness (generalized): Secondary | ICD-10-CM | POA: Diagnosis not present

## 2015-10-30 DIAGNOSIS — N390 Urinary tract infection, site not specified: Secondary | ICD-10-CM | POA: Diagnosis not present

## 2015-10-30 DIAGNOSIS — N184 Chronic kidney disease, stage 4 (severe): Secondary | ICD-10-CM | POA: Diagnosis not present

## 2015-10-30 DIAGNOSIS — R2681 Unsteadiness on feet: Secondary | ICD-10-CM | POA: Diagnosis not present

## 2015-10-30 DIAGNOSIS — Z9181 History of falling: Secondary | ICD-10-CM | POA: Diagnosis not present

## 2015-10-30 DIAGNOSIS — K219 Gastro-esophageal reflux disease without esophagitis: Secondary | ICD-10-CM | POA: Diagnosis not present

## 2015-10-30 DIAGNOSIS — M6281 Muscle weakness (generalized): Secondary | ICD-10-CM | POA: Diagnosis not present

## 2015-10-30 DIAGNOSIS — I129 Hypertensive chronic kidney disease with stage 1 through stage 4 chronic kidney disease, or unspecified chronic kidney disease: Secondary | ICD-10-CM | POA: Diagnosis not present

## 2015-10-31 DIAGNOSIS — R2681 Unsteadiness on feet: Secondary | ICD-10-CM | POA: Diagnosis not present

## 2015-10-31 DIAGNOSIS — K219 Gastro-esophageal reflux disease without esophagitis: Secondary | ICD-10-CM | POA: Diagnosis not present

## 2015-10-31 DIAGNOSIS — N184 Chronic kidney disease, stage 4 (severe): Secondary | ICD-10-CM | POA: Diagnosis not present

## 2015-10-31 DIAGNOSIS — M6281 Muscle weakness (generalized): Secondary | ICD-10-CM | POA: Diagnosis not present

## 2015-10-31 DIAGNOSIS — R270 Ataxia, unspecified: Secondary | ICD-10-CM | POA: Diagnosis not present

## 2015-10-31 DIAGNOSIS — I129 Hypertensive chronic kidney disease with stage 1 through stage 4 chronic kidney disease, or unspecified chronic kidney disease: Secondary | ICD-10-CM | POA: Diagnosis not present

## 2015-10-31 DIAGNOSIS — R5382 Chronic fatigue, unspecified: Secondary | ICD-10-CM | POA: Diagnosis not present

## 2015-10-31 DIAGNOSIS — B372 Candidiasis of skin and nail: Secondary | ICD-10-CM | POA: Diagnosis not present

## 2015-10-31 DIAGNOSIS — L089 Local infection of the skin and subcutaneous tissue, unspecified: Secondary | ICD-10-CM | POA: Diagnosis not present

## 2015-10-31 DIAGNOSIS — F419 Anxiety disorder, unspecified: Secondary | ICD-10-CM | POA: Diagnosis not present

## 2015-10-31 DIAGNOSIS — I10 Essential (primary) hypertension: Secondary | ICD-10-CM | POA: Diagnosis not present

## 2015-10-31 DIAGNOSIS — R946 Abnormal results of thyroid function studies: Secondary | ICD-10-CM | POA: Diagnosis not present

## 2015-10-31 DIAGNOSIS — N390 Urinary tract infection, site not specified: Secondary | ICD-10-CM | POA: Diagnosis not present

## 2015-10-31 DIAGNOSIS — M159 Polyosteoarthritis, unspecified: Secondary | ICD-10-CM | POA: Diagnosis not present

## 2015-10-31 DIAGNOSIS — Z9181 History of falling: Secondary | ICD-10-CM | POA: Diagnosis not present

## 2015-10-31 DIAGNOSIS — Z681 Body mass index (BMI) 19 or less, adult: Secondary | ICD-10-CM | POA: Diagnosis not present

## 2015-11-01 DIAGNOSIS — N390 Urinary tract infection, site not specified: Secondary | ICD-10-CM | POA: Diagnosis not present

## 2015-11-01 DIAGNOSIS — I129 Hypertensive chronic kidney disease with stage 1 through stage 4 chronic kidney disease, or unspecified chronic kidney disease: Secondary | ICD-10-CM | POA: Diagnosis not present

## 2015-11-01 DIAGNOSIS — K219 Gastro-esophageal reflux disease without esophagitis: Secondary | ICD-10-CM | POA: Diagnosis not present

## 2015-11-01 DIAGNOSIS — N184 Chronic kidney disease, stage 4 (severe): Secondary | ICD-10-CM | POA: Diagnosis not present

## 2015-11-01 DIAGNOSIS — Z9181 History of falling: Secondary | ICD-10-CM | POA: Diagnosis not present

## 2015-11-01 DIAGNOSIS — M6281 Muscle weakness (generalized): Secondary | ICD-10-CM | POA: Diagnosis not present

## 2015-11-01 DIAGNOSIS — R2681 Unsteadiness on feet: Secondary | ICD-10-CM | POA: Diagnosis not present

## 2015-11-04 DIAGNOSIS — N390 Urinary tract infection, site not specified: Secondary | ICD-10-CM | POA: Diagnosis not present

## 2015-11-04 DIAGNOSIS — M6281 Muscle weakness (generalized): Secondary | ICD-10-CM | POA: Diagnosis not present

## 2015-11-04 DIAGNOSIS — Z9181 History of falling: Secondary | ICD-10-CM | POA: Diagnosis not present

## 2015-11-04 DIAGNOSIS — K219 Gastro-esophageal reflux disease without esophagitis: Secondary | ICD-10-CM | POA: Diagnosis not present

## 2015-11-04 DIAGNOSIS — R2681 Unsteadiness on feet: Secondary | ICD-10-CM | POA: Diagnosis not present

## 2015-11-04 DIAGNOSIS — N184 Chronic kidney disease, stage 4 (severe): Secondary | ICD-10-CM | POA: Diagnosis not present

## 2015-11-04 DIAGNOSIS — I129 Hypertensive chronic kidney disease with stage 1 through stage 4 chronic kidney disease, or unspecified chronic kidney disease: Secondary | ICD-10-CM | POA: Diagnosis not present

## 2015-11-05 DIAGNOSIS — Z9181 History of falling: Secondary | ICD-10-CM | POA: Diagnosis not present

## 2015-11-05 DIAGNOSIS — R2681 Unsteadiness on feet: Secondary | ICD-10-CM | POA: Diagnosis not present

## 2015-11-05 DIAGNOSIS — K219 Gastro-esophageal reflux disease without esophagitis: Secondary | ICD-10-CM | POA: Diagnosis not present

## 2015-11-05 DIAGNOSIS — N184 Chronic kidney disease, stage 4 (severe): Secondary | ICD-10-CM | POA: Diagnosis not present

## 2015-11-05 DIAGNOSIS — M6281 Muscle weakness (generalized): Secondary | ICD-10-CM | POA: Diagnosis not present

## 2015-11-05 DIAGNOSIS — I129 Hypertensive chronic kidney disease with stage 1 through stage 4 chronic kidney disease, or unspecified chronic kidney disease: Secondary | ICD-10-CM | POA: Diagnosis not present

## 2015-11-05 DIAGNOSIS — N390 Urinary tract infection, site not specified: Secondary | ICD-10-CM | POA: Diagnosis not present

## 2015-11-07 DIAGNOSIS — I129 Hypertensive chronic kidney disease with stage 1 through stage 4 chronic kidney disease, or unspecified chronic kidney disease: Secondary | ICD-10-CM | POA: Diagnosis not present

## 2015-11-07 DIAGNOSIS — M6281 Muscle weakness (generalized): Secondary | ICD-10-CM | POA: Diagnosis not present

## 2015-11-07 DIAGNOSIS — Z9181 History of falling: Secondary | ICD-10-CM | POA: Diagnosis not present

## 2015-11-07 DIAGNOSIS — R2681 Unsteadiness on feet: Secondary | ICD-10-CM | POA: Diagnosis not present

## 2015-11-07 DIAGNOSIS — N184 Chronic kidney disease, stage 4 (severe): Secondary | ICD-10-CM | POA: Diagnosis not present

## 2015-11-07 DIAGNOSIS — K219 Gastro-esophageal reflux disease without esophagitis: Secondary | ICD-10-CM | POA: Diagnosis not present

## 2015-11-07 DIAGNOSIS — N390 Urinary tract infection, site not specified: Secondary | ICD-10-CM | POA: Diagnosis not present

## 2015-11-11 DIAGNOSIS — R2681 Unsteadiness on feet: Secondary | ICD-10-CM | POA: Diagnosis not present

## 2015-11-11 DIAGNOSIS — M6281 Muscle weakness (generalized): Secondary | ICD-10-CM | POA: Diagnosis not present

## 2015-11-11 DIAGNOSIS — I129 Hypertensive chronic kidney disease with stage 1 through stage 4 chronic kidney disease, or unspecified chronic kidney disease: Secondary | ICD-10-CM | POA: Diagnosis not present

## 2015-11-11 DIAGNOSIS — Z9181 History of falling: Secondary | ICD-10-CM | POA: Diagnosis not present

## 2015-11-11 DIAGNOSIS — K219 Gastro-esophageal reflux disease without esophagitis: Secondary | ICD-10-CM | POA: Diagnosis not present

## 2015-11-11 DIAGNOSIS — N390 Urinary tract infection, site not specified: Secondary | ICD-10-CM | POA: Diagnosis not present

## 2015-11-11 DIAGNOSIS — N184 Chronic kidney disease, stage 4 (severe): Secondary | ICD-10-CM | POA: Diagnosis not present

## 2015-11-13 DIAGNOSIS — R2681 Unsteadiness on feet: Secondary | ICD-10-CM | POA: Diagnosis not present

## 2015-11-13 DIAGNOSIS — N390 Urinary tract infection, site not specified: Secondary | ICD-10-CM | POA: Diagnosis not present

## 2015-11-13 DIAGNOSIS — N184 Chronic kidney disease, stage 4 (severe): Secondary | ICD-10-CM | POA: Diagnosis not present

## 2015-11-13 DIAGNOSIS — I129 Hypertensive chronic kidney disease with stage 1 through stage 4 chronic kidney disease, or unspecified chronic kidney disease: Secondary | ICD-10-CM | POA: Diagnosis not present

## 2015-11-13 DIAGNOSIS — M6281 Muscle weakness (generalized): Secondary | ICD-10-CM | POA: Diagnosis not present

## 2015-11-13 DIAGNOSIS — Z9181 History of falling: Secondary | ICD-10-CM | POA: Diagnosis not present

## 2015-11-13 DIAGNOSIS — K219 Gastro-esophageal reflux disease without esophagitis: Secondary | ICD-10-CM | POA: Diagnosis not present

## 2015-11-14 DIAGNOSIS — R2681 Unsteadiness on feet: Secondary | ICD-10-CM | POA: Diagnosis not present

## 2015-11-14 DIAGNOSIS — Z9181 History of falling: Secondary | ICD-10-CM | POA: Diagnosis not present

## 2015-11-14 DIAGNOSIS — K219 Gastro-esophageal reflux disease without esophagitis: Secondary | ICD-10-CM | POA: Diagnosis not present

## 2015-11-14 DIAGNOSIS — N390 Urinary tract infection, site not specified: Secondary | ICD-10-CM | POA: Diagnosis not present

## 2015-11-14 DIAGNOSIS — S51802A Unspecified open wound of left forearm, initial encounter: Secondary | ICD-10-CM | POA: Diagnosis not present

## 2015-11-14 DIAGNOSIS — N184 Chronic kidney disease, stage 4 (severe): Secondary | ICD-10-CM | POA: Diagnosis not present

## 2015-11-14 DIAGNOSIS — M6281 Muscle weakness (generalized): Secondary | ICD-10-CM | POA: Diagnosis not present

## 2015-11-14 DIAGNOSIS — I129 Hypertensive chronic kidney disease with stage 1 through stage 4 chronic kidney disease, or unspecified chronic kidney disease: Secondary | ICD-10-CM | POA: Diagnosis not present

## 2015-11-15 DIAGNOSIS — R2681 Unsteadiness on feet: Secondary | ICD-10-CM | POA: Diagnosis not present

## 2015-11-15 DIAGNOSIS — I129 Hypertensive chronic kidney disease with stage 1 through stage 4 chronic kidney disease, or unspecified chronic kidney disease: Secondary | ICD-10-CM | POA: Diagnosis not present

## 2015-11-15 DIAGNOSIS — K219 Gastro-esophageal reflux disease without esophagitis: Secondary | ICD-10-CM | POA: Diagnosis not present

## 2015-11-15 DIAGNOSIS — N184 Chronic kidney disease, stage 4 (severe): Secondary | ICD-10-CM | POA: Diagnosis not present

## 2015-11-15 DIAGNOSIS — M6281 Muscle weakness (generalized): Secondary | ICD-10-CM | POA: Diagnosis not present

## 2015-11-15 DIAGNOSIS — Z9181 History of falling: Secondary | ICD-10-CM | POA: Diagnosis not present

## 2015-11-15 DIAGNOSIS — N390 Urinary tract infection, site not specified: Secondary | ICD-10-CM | POA: Diagnosis not present

## 2015-11-26 DIAGNOSIS — N390 Urinary tract infection, site not specified: Secondary | ICD-10-CM | POA: Diagnosis not present

## 2015-11-26 DIAGNOSIS — I129 Hypertensive chronic kidney disease with stage 1 through stage 4 chronic kidney disease, or unspecified chronic kidney disease: Secondary | ICD-10-CM | POA: Diagnosis not present

## 2015-11-26 DIAGNOSIS — M6281 Muscle weakness (generalized): Secondary | ICD-10-CM | POA: Diagnosis not present

## 2015-11-26 DIAGNOSIS — K219 Gastro-esophageal reflux disease without esophagitis: Secondary | ICD-10-CM | POA: Diagnosis not present

## 2015-11-26 DIAGNOSIS — R7989 Other specified abnormal findings of blood chemistry: Secondary | ICD-10-CM | POA: Diagnosis not present

## 2015-11-26 DIAGNOSIS — Z9181 History of falling: Secondary | ICD-10-CM | POA: Diagnosis not present

## 2015-11-26 DIAGNOSIS — R2681 Unsteadiness on feet: Secondary | ICD-10-CM | POA: Diagnosis not present

## 2015-11-26 DIAGNOSIS — N184 Chronic kidney disease, stage 4 (severe): Secondary | ICD-10-CM | POA: Diagnosis not present

## 2015-11-26 DIAGNOSIS — T50901A Poisoning by unspecified drugs, medicaments and biological substances, accidental (unintentional), initial encounter: Secondary | ICD-10-CM | POA: Diagnosis not present

## 2015-11-27 DIAGNOSIS — I129 Hypertensive chronic kidney disease with stage 1 through stage 4 chronic kidney disease, or unspecified chronic kidney disease: Secondary | ICD-10-CM | POA: Diagnosis not present

## 2015-11-27 DIAGNOSIS — M6281 Muscle weakness (generalized): Secondary | ICD-10-CM | POA: Diagnosis not present

## 2015-11-27 DIAGNOSIS — Z9181 History of falling: Secondary | ICD-10-CM | POA: Diagnosis not present

## 2015-11-27 DIAGNOSIS — K219 Gastro-esophageal reflux disease without esophagitis: Secondary | ICD-10-CM | POA: Diagnosis not present

## 2015-11-27 DIAGNOSIS — N184 Chronic kidney disease, stage 4 (severe): Secondary | ICD-10-CM | POA: Diagnosis not present

## 2015-11-27 DIAGNOSIS — N390 Urinary tract infection, site not specified: Secondary | ICD-10-CM | POA: Diagnosis not present

## 2015-11-27 DIAGNOSIS — R2681 Unsteadiness on feet: Secondary | ICD-10-CM | POA: Diagnosis not present

## 2015-11-28 DIAGNOSIS — Z9181 History of falling: Secondary | ICD-10-CM | POA: Diagnosis not present

## 2015-11-28 DIAGNOSIS — K219 Gastro-esophageal reflux disease without esophagitis: Secondary | ICD-10-CM | POA: Diagnosis not present

## 2015-11-28 DIAGNOSIS — M6281 Muscle weakness (generalized): Secondary | ICD-10-CM | POA: Diagnosis not present

## 2015-11-28 DIAGNOSIS — I129 Hypertensive chronic kidney disease with stage 1 through stage 4 chronic kidney disease, or unspecified chronic kidney disease: Secondary | ICD-10-CM | POA: Diagnosis not present

## 2015-11-28 DIAGNOSIS — R2681 Unsteadiness on feet: Secondary | ICD-10-CM | POA: Diagnosis not present

## 2015-11-28 DIAGNOSIS — N390 Urinary tract infection, site not specified: Secondary | ICD-10-CM | POA: Diagnosis not present

## 2015-11-28 DIAGNOSIS — N184 Chronic kidney disease, stage 4 (severe): Secondary | ICD-10-CM | POA: Diagnosis not present

## 2015-11-29 DIAGNOSIS — N390 Urinary tract infection, site not specified: Secondary | ICD-10-CM | POA: Diagnosis not present

## 2015-11-29 DIAGNOSIS — M6281 Muscle weakness (generalized): Secondary | ICD-10-CM | POA: Diagnosis not present

## 2015-11-29 DIAGNOSIS — R2681 Unsteadiness on feet: Secondary | ICD-10-CM | POA: Diagnosis not present

## 2015-11-29 DIAGNOSIS — N184 Chronic kidney disease, stage 4 (severe): Secondary | ICD-10-CM | POA: Diagnosis not present

## 2015-11-29 DIAGNOSIS — I129 Hypertensive chronic kidney disease with stage 1 through stage 4 chronic kidney disease, or unspecified chronic kidney disease: Secondary | ICD-10-CM | POA: Diagnosis not present

## 2015-11-29 DIAGNOSIS — Z9181 History of falling: Secondary | ICD-10-CM | POA: Diagnosis not present

## 2015-11-29 DIAGNOSIS — K219 Gastro-esophageal reflux disease without esophagitis: Secondary | ICD-10-CM | POA: Diagnosis not present

## 2015-12-03 DIAGNOSIS — K219 Gastro-esophageal reflux disease without esophagitis: Secondary | ICD-10-CM | POA: Diagnosis not present

## 2015-12-03 DIAGNOSIS — Z9181 History of falling: Secondary | ICD-10-CM | POA: Diagnosis not present

## 2015-12-03 DIAGNOSIS — N390 Urinary tract infection, site not specified: Secondary | ICD-10-CM | POA: Diagnosis not present

## 2015-12-03 DIAGNOSIS — M6281 Muscle weakness (generalized): Secondary | ICD-10-CM | POA: Diagnosis not present

## 2015-12-03 DIAGNOSIS — R2681 Unsteadiness on feet: Secondary | ICD-10-CM | POA: Diagnosis not present

## 2015-12-03 DIAGNOSIS — I129 Hypertensive chronic kidney disease with stage 1 through stage 4 chronic kidney disease, or unspecified chronic kidney disease: Secondary | ICD-10-CM | POA: Diagnosis not present

## 2015-12-03 DIAGNOSIS — N184 Chronic kidney disease, stage 4 (severe): Secondary | ICD-10-CM | POA: Diagnosis not present

## 2015-12-04 DIAGNOSIS — N390 Urinary tract infection, site not specified: Secondary | ICD-10-CM | POA: Diagnosis not present

## 2015-12-04 DIAGNOSIS — K219 Gastro-esophageal reflux disease without esophagitis: Secondary | ICD-10-CM | POA: Diagnosis not present

## 2015-12-04 DIAGNOSIS — M6281 Muscle weakness (generalized): Secondary | ICD-10-CM | POA: Diagnosis not present

## 2015-12-04 DIAGNOSIS — Z9181 History of falling: Secondary | ICD-10-CM | POA: Diagnosis not present

## 2015-12-04 DIAGNOSIS — N184 Chronic kidney disease, stage 4 (severe): Secondary | ICD-10-CM | POA: Diagnosis not present

## 2015-12-04 DIAGNOSIS — I129 Hypertensive chronic kidney disease with stage 1 through stage 4 chronic kidney disease, or unspecified chronic kidney disease: Secondary | ICD-10-CM | POA: Diagnosis not present

## 2015-12-04 DIAGNOSIS — R2681 Unsteadiness on feet: Secondary | ICD-10-CM | POA: Diagnosis not present

## 2015-12-06 DIAGNOSIS — K219 Gastro-esophageal reflux disease without esophagitis: Secondary | ICD-10-CM | POA: Diagnosis not present

## 2015-12-06 DIAGNOSIS — R2681 Unsteadiness on feet: Secondary | ICD-10-CM | POA: Diagnosis not present

## 2015-12-06 DIAGNOSIS — N184 Chronic kidney disease, stage 4 (severe): Secondary | ICD-10-CM | POA: Diagnosis not present

## 2015-12-06 DIAGNOSIS — N390 Urinary tract infection, site not specified: Secondary | ICD-10-CM | POA: Diagnosis not present

## 2015-12-06 DIAGNOSIS — I129 Hypertensive chronic kidney disease with stage 1 through stage 4 chronic kidney disease, or unspecified chronic kidney disease: Secondary | ICD-10-CM | POA: Diagnosis not present

## 2015-12-06 DIAGNOSIS — Z9181 History of falling: Secondary | ICD-10-CM | POA: Diagnosis not present

## 2015-12-06 DIAGNOSIS — M6281 Muscle weakness (generalized): Secondary | ICD-10-CM | POA: Diagnosis not present

## 2015-12-10 DIAGNOSIS — K219 Gastro-esophageal reflux disease without esophagitis: Secondary | ICD-10-CM | POA: Diagnosis not present

## 2015-12-10 DIAGNOSIS — N390 Urinary tract infection, site not specified: Secondary | ICD-10-CM | POA: Diagnosis not present

## 2015-12-10 DIAGNOSIS — I129 Hypertensive chronic kidney disease with stage 1 through stage 4 chronic kidney disease, or unspecified chronic kidney disease: Secondary | ICD-10-CM | POA: Diagnosis not present

## 2015-12-10 DIAGNOSIS — Z9181 History of falling: Secondary | ICD-10-CM | POA: Diagnosis not present

## 2015-12-10 DIAGNOSIS — N184 Chronic kidney disease, stage 4 (severe): Secondary | ICD-10-CM | POA: Diagnosis not present

## 2015-12-10 DIAGNOSIS — M6281 Muscle weakness (generalized): Secondary | ICD-10-CM | POA: Diagnosis not present

## 2015-12-10 DIAGNOSIS — R2681 Unsteadiness on feet: Secondary | ICD-10-CM | POA: Diagnosis not present

## 2015-12-30 DIAGNOSIS — R310 Gross hematuria: Secondary | ICD-10-CM | POA: Diagnosis not present

## 2015-12-30 DIAGNOSIS — N390 Urinary tract infection, site not specified: Secondary | ICD-10-CM | POA: Diagnosis not present

## 2015-12-31 DIAGNOSIS — Z23 Encounter for immunization: Secondary | ICD-10-CM | POA: Diagnosis not present

## 2015-12-31 DIAGNOSIS — R5382 Chronic fatigue, unspecified: Secondary | ICD-10-CM | POA: Diagnosis not present

## 2015-12-31 DIAGNOSIS — E559 Vitamin D deficiency, unspecified: Secondary | ICD-10-CM | POA: Diagnosis not present

## 2015-12-31 DIAGNOSIS — Z681 Body mass index (BMI) 19 or less, adult: Secondary | ICD-10-CM | POA: Diagnosis not present

## 2015-12-31 DIAGNOSIS — F419 Anxiety disorder, unspecified: Secondary | ICD-10-CM | POA: Diagnosis not present

## 2015-12-31 DIAGNOSIS — N184 Chronic kidney disease, stage 4 (severe): Secondary | ICD-10-CM | POA: Diagnosis not present

## 2015-12-31 DIAGNOSIS — Z1231 Encounter for screening mammogram for malignant neoplasm of breast: Secondary | ICD-10-CM | POA: Diagnosis not present

## 2015-12-31 DIAGNOSIS — Z79899 Other long term (current) drug therapy: Secondary | ICD-10-CM | POA: Diagnosis not present

## 2015-12-31 DIAGNOSIS — M159 Polyosteoarthritis, unspecified: Secondary | ICD-10-CM | POA: Diagnosis not present

## 2015-12-31 DIAGNOSIS — I10 Essential (primary) hypertension: Secondary | ICD-10-CM | POA: Diagnosis not present

## 2016-01-24 DIAGNOSIS — Z96651 Presence of right artificial knee joint: Secondary | ICD-10-CM | POA: Diagnosis not present

## 2016-01-27 DIAGNOSIS — L819 Disorder of pigmentation, unspecified: Secondary | ICD-10-CM | POA: Diagnosis not present

## 2016-01-27 DIAGNOSIS — D649 Anemia, unspecified: Secondary | ICD-10-CM | POA: Diagnosis not present

## 2016-01-27 DIAGNOSIS — Z681 Body mass index (BMI) 19 or less, adult: Secondary | ICD-10-CM | POA: Diagnosis not present

## 2016-01-27 DIAGNOSIS — N6453 Retraction of nipple: Secondary | ICD-10-CM | POA: Diagnosis not present

## 2016-01-28 DIAGNOSIS — N3289 Other specified disorders of bladder: Secondary | ICD-10-CM | POA: Diagnosis not present

## 2016-01-28 DIAGNOSIS — N3090 Cystitis, unspecified without hematuria: Secondary | ICD-10-CM | POA: Diagnosis not present

## 2016-02-12 DIAGNOSIS — N6453 Retraction of nipple: Secondary | ICD-10-CM | POA: Diagnosis not present

## 2016-02-12 DIAGNOSIS — N6489 Other specified disorders of breast: Secondary | ICD-10-CM | POA: Diagnosis not present

## 2016-02-12 DIAGNOSIS — R928 Other abnormal and inconclusive findings on diagnostic imaging of breast: Secondary | ICD-10-CM | POA: Diagnosis not present

## 2016-02-13 DIAGNOSIS — D0359 Melanoma in situ of other part of trunk: Secondary | ICD-10-CM | POA: Diagnosis not present

## 2016-02-13 DIAGNOSIS — L578 Other skin changes due to chronic exposure to nonionizing radiation: Secondary | ICD-10-CM | POA: Diagnosis not present

## 2016-02-13 DIAGNOSIS — D0371 Melanoma in situ of right lower limb, including hip: Secondary | ICD-10-CM | POA: Diagnosis not present

## 2016-02-13 DIAGNOSIS — L814 Other melanin hyperpigmentation: Secondary | ICD-10-CM | POA: Diagnosis not present

## 2016-02-21 DIAGNOSIS — D649 Anemia, unspecified: Secondary | ICD-10-CM | POA: Diagnosis not present

## 2016-02-25 DIAGNOSIS — Z681 Body mass index (BMI) 19 or less, adult: Secondary | ICD-10-CM | POA: Diagnosis not present

## 2016-02-25 DIAGNOSIS — J069 Acute upper respiratory infection, unspecified: Secondary | ICD-10-CM | POA: Diagnosis not present

## 2016-02-25 DIAGNOSIS — R52 Pain, unspecified: Secondary | ICD-10-CM | POA: Diagnosis not present

## 2016-03-04 DIAGNOSIS — I10 Essential (primary) hypertension: Secondary | ICD-10-CM | POA: Diagnosis not present

## 2016-03-04 DIAGNOSIS — Z681 Body mass index (BMI) 19 or less, adult: Secondary | ICD-10-CM | POA: Diagnosis not present

## 2016-03-04 DIAGNOSIS — J40 Bronchitis, not specified as acute or chronic: Secondary | ICD-10-CM | POA: Diagnosis not present

## 2016-03-31 DIAGNOSIS — D0371 Melanoma in situ of right lower limb, including hip: Secondary | ICD-10-CM | POA: Diagnosis not present

## 2016-04-23 DIAGNOSIS — Z681 Body mass index (BMI) 19 or less, adult: Secondary | ICD-10-CM | POA: Diagnosis not present

## 2016-04-23 DIAGNOSIS — G4762 Sleep related leg cramps: Secondary | ICD-10-CM | POA: Diagnosis not present

## 2016-04-23 DIAGNOSIS — R251 Tremor, unspecified: Secondary | ICD-10-CM | POA: Diagnosis not present

## 2016-05-06 DIAGNOSIS — D0359 Melanoma in situ of other part of trunk: Secondary | ICD-10-CM | POA: Diagnosis not present

## 2016-05-13 DIAGNOSIS — N390 Urinary tract infection, site not specified: Secondary | ICD-10-CM | POA: Diagnosis not present

## 2016-05-13 DIAGNOSIS — Z681 Body mass index (BMI) 19 or less, adult: Secondary | ICD-10-CM | POA: Diagnosis not present

## 2016-05-13 DIAGNOSIS — R270 Ataxia, unspecified: Secondary | ICD-10-CM | POA: Diagnosis not present

## 2016-05-13 DIAGNOSIS — Z9181 History of falling: Secondary | ICD-10-CM | POA: Diagnosis not present

## 2016-05-13 DIAGNOSIS — K219 Gastro-esophageal reflux disease without esophagitis: Secondary | ICD-10-CM | POA: Diagnosis not present

## 2016-05-13 DIAGNOSIS — I10 Essential (primary) hypertension: Secondary | ICD-10-CM | POA: Diagnosis not present

## 2016-05-13 DIAGNOSIS — N184 Chronic kidney disease, stage 4 (severe): Secondary | ICD-10-CM | POA: Diagnosis not present

## 2016-05-13 DIAGNOSIS — Z1389 Encounter for screening for other disorder: Secondary | ICD-10-CM | POA: Diagnosis not present

## 2016-05-13 DIAGNOSIS — D649 Anemia, unspecified: Secondary | ICD-10-CM | POA: Diagnosis not present

## 2016-05-13 DIAGNOSIS — N289 Disorder of kidney and ureter, unspecified: Secondary | ICD-10-CM | POA: Diagnosis not present

## 2016-05-20 DIAGNOSIS — H6123 Impacted cerumen, bilateral: Secondary | ICD-10-CM | POA: Diagnosis not present

## 2016-05-20 DIAGNOSIS — H9190 Unspecified hearing loss, unspecified ear: Secondary | ICD-10-CM | POA: Diagnosis not present

## 2016-05-20 DIAGNOSIS — H905 Unspecified sensorineural hearing loss: Secondary | ICD-10-CM | POA: Diagnosis not present

## 2016-05-20 DIAGNOSIS — R030 Elevated blood-pressure reading, without diagnosis of hypertension: Secondary | ICD-10-CM | POA: Diagnosis not present

## 2016-05-20 DIAGNOSIS — Z8669 Personal history of other diseases of the nervous system and sense organs: Secondary | ICD-10-CM | POA: Diagnosis not present

## 2016-05-20 DIAGNOSIS — H903 Sensorineural hearing loss, bilateral: Secondary | ICD-10-CM | POA: Diagnosis not present

## 2016-05-20 DIAGNOSIS — J342 Deviated nasal septum: Secondary | ICD-10-CM | POA: Diagnosis not present

## 2016-05-22 DIAGNOSIS — E875 Hyperkalemia: Secondary | ICD-10-CM | POA: Diagnosis not present

## 2016-07-10 DIAGNOSIS — Z96651 Presence of right artificial knee joint: Secondary | ICD-10-CM | POA: Diagnosis not present

## 2016-07-10 DIAGNOSIS — M1711 Unilateral primary osteoarthritis, right knee: Secondary | ICD-10-CM | POA: Diagnosis not present

## 2016-07-22 DIAGNOSIS — L720 Epidermal cyst: Secondary | ICD-10-CM | POA: Diagnosis not present

## 2016-08-12 DIAGNOSIS — I10 Essential (primary) hypertension: Secondary | ICD-10-CM | POA: Diagnosis not present

## 2016-08-12 DIAGNOSIS — M159 Polyosteoarthritis, unspecified: Secondary | ICD-10-CM | POA: Diagnosis not present

## 2016-08-12 DIAGNOSIS — N184 Chronic kidney disease, stage 4 (severe): Secondary | ICD-10-CM | POA: Diagnosis not present

## 2016-08-12 DIAGNOSIS — E559 Vitamin D deficiency, unspecified: Secondary | ICD-10-CM | POA: Diagnosis not present

## 2016-08-12 DIAGNOSIS — Z79899 Other long term (current) drug therapy: Secondary | ICD-10-CM | POA: Diagnosis not present

## 2016-08-12 DIAGNOSIS — D649 Anemia, unspecified: Secondary | ICD-10-CM | POA: Diagnosis not present

## 2016-08-12 DIAGNOSIS — E7800 Pure hypercholesterolemia, unspecified: Secondary | ICD-10-CM | POA: Diagnosis not present

## 2016-08-12 DIAGNOSIS — Z6822 Body mass index (BMI) 22.0-22.9, adult: Secondary | ICD-10-CM | POA: Diagnosis not present

## 2016-08-12 DIAGNOSIS — R609 Edema, unspecified: Secondary | ICD-10-CM | POA: Diagnosis not present

## 2016-08-12 DIAGNOSIS — K219 Gastro-esophageal reflux disease without esophagitis: Secondary | ICD-10-CM | POA: Diagnosis not present

## 2016-09-17 DIAGNOSIS — M1712 Unilateral primary osteoarthritis, left knee: Secondary | ICD-10-CM | POA: Diagnosis not present

## 2016-09-17 DIAGNOSIS — Z6822 Body mass index (BMI) 22.0-22.9, adult: Secondary | ICD-10-CM | POA: Diagnosis not present

## 2016-09-17 DIAGNOSIS — K0510 Chronic gingivitis, plaque induced: Secondary | ICD-10-CM | POA: Diagnosis not present

## 2016-10-06 DIAGNOSIS — Z6821 Body mass index (BMI) 21.0-21.9, adult: Secondary | ICD-10-CM | POA: Diagnosis not present

## 2016-10-06 DIAGNOSIS — B028 Zoster with other complications: Secondary | ICD-10-CM | POA: Diagnosis not present

## 2016-10-06 DIAGNOSIS — I10 Essential (primary) hypertension: Secondary | ICD-10-CM | POA: Diagnosis not present

## 2016-10-20 DIAGNOSIS — M1712 Unilateral primary osteoarthritis, left knee: Secondary | ICD-10-CM | POA: Diagnosis not present

## 2016-10-20 DIAGNOSIS — B028 Zoster with other complications: Secondary | ICD-10-CM | POA: Diagnosis not present

## 2016-10-20 DIAGNOSIS — R3989 Other symptoms and signs involving the genitourinary system: Secondary | ICD-10-CM | POA: Diagnosis not present

## 2016-10-20 DIAGNOSIS — Z6826 Body mass index (BMI) 26.0-26.9, adult: Secondary | ICD-10-CM | POA: Diagnosis not present

## 2016-10-20 DIAGNOSIS — M545 Low back pain: Secondary | ICD-10-CM | POA: Diagnosis not present

## 2016-10-26 DIAGNOSIS — Z96651 Presence of right artificial knee joint: Secondary | ICD-10-CM | POA: Diagnosis not present
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
## 2016-12-21 DIAGNOSIS — Z6826 Body mass index (BMI) 26.0-26.9, adult: Secondary | ICD-10-CM | POA: Diagnosis not present

## 2016-12-21 DIAGNOSIS — M810 Age-related osteoporosis without current pathological fracture: Secondary | ICD-10-CM | POA: Diagnosis not present

## 2016-12-21 DIAGNOSIS — Z79899 Other long term (current) drug therapy: Secondary | ICD-10-CM | POA: Diagnosis not present

## 2016-12-21 DIAGNOSIS — E663 Overweight: Secondary | ICD-10-CM | POA: Diagnosis not present

## 2016-12-21 DIAGNOSIS — R609 Edema, unspecified: Secondary | ICD-10-CM | POA: Diagnosis not present

## 2016-12-21 DIAGNOSIS — I10 Essential (primary) hypertension: Secondary | ICD-10-CM | POA: Diagnosis not present

## 2016-12-21 DIAGNOSIS — M159 Polyosteoarthritis, unspecified: Secondary | ICD-10-CM | POA: Diagnosis not present

## 2016-12-21 DIAGNOSIS — N184 Chronic kidney disease, stage 4 (severe): Secondary | ICD-10-CM | POA: Diagnosis not present

## 2016-12-21 DIAGNOSIS — E7800 Pure hypercholesterolemia, unspecified: Secondary | ICD-10-CM | POA: Diagnosis not present

## 2016-12-21 DIAGNOSIS — Z131 Encounter for screening for diabetes mellitus: Secondary | ICD-10-CM | POA: Diagnosis not present

## 2016-12-21 DIAGNOSIS — Z23 Encounter for immunization: Secondary | ICD-10-CM | POA: Diagnosis not present

## 2017-02-05 DIAGNOSIS — H9201 Otalgia, right ear: Secondary | ICD-10-CM | POA: Diagnosis not present

## 2017-02-05 DIAGNOSIS — Z6822 Body mass index (BMI) 22.0-22.9, adult: Secondary | ICD-10-CM | POA: Diagnosis not present

## 2017-03-15 DIAGNOSIS — I10 Essential (primary) hypertension: Secondary | ICD-10-CM | POA: Diagnosis not present

## 2017-03-15 DIAGNOSIS — N343 Urethral syndrome, unspecified: Secondary | ICD-10-CM | POA: Diagnosis not present

## 2017-03-15 DIAGNOSIS — R197 Diarrhea, unspecified: Secondary | ICD-10-CM | POA: Diagnosis not present

## 2017-03-15 DIAGNOSIS — G4700 Insomnia, unspecified: Secondary | ICD-10-CM | POA: Diagnosis not present

## 2017-03-15 DIAGNOSIS — Z6823 Body mass index (BMI) 23.0-23.9, adult: Secondary | ICD-10-CM | POA: Diagnosis not present

## 2017-03-15 DIAGNOSIS — F419 Anxiety disorder, unspecified: Secondary | ICD-10-CM | POA: Diagnosis not present

## 2017-03-17 DIAGNOSIS — R197 Diarrhea, unspecified: Secondary | ICD-10-CM | POA: Diagnosis not present

## 2017-03-18 DIAGNOSIS — R197 Diarrhea, unspecified: Secondary | ICD-10-CM | POA: Diagnosis not present

## 2017-03-19 DIAGNOSIS — M25561 Pain in right knee: Secondary | ICD-10-CM | POA: Diagnosis not present

## 2017-03-29 DIAGNOSIS — Z96651 Presence of right artificial knee joint: Secondary | ICD-10-CM | POA: Diagnosis not present

## 2017-03-29 DIAGNOSIS — I129 Hypertensive chronic kidney disease with stage 1 through stage 4 chronic kidney disease, or unspecified chronic kidney disease: Secondary | ICD-10-CM | POA: Diagnosis not present

## 2017-03-29 DIAGNOSIS — Z9181 History of falling: Secondary | ICD-10-CM | POA: Diagnosis not present

## 2017-03-29 DIAGNOSIS — K219 Gastro-esophageal reflux disease without esophagitis: Secondary | ICD-10-CM | POA: Diagnosis not present

## 2017-03-29 DIAGNOSIS — N184 Chronic kidney disease, stage 4 (severe): Secondary | ICD-10-CM | POA: Diagnosis not present

## 2017-03-29 DIAGNOSIS — M25561 Pain in right knee: Secondary | ICD-10-CM | POA: Diagnosis not present

## 2017-04-04 DIAGNOSIS — N184 Chronic kidney disease, stage 4 (severe): Secondary | ICD-10-CM | POA: Diagnosis not present

## 2017-04-04 DIAGNOSIS — Z9181 History of falling: Secondary | ICD-10-CM | POA: Diagnosis not present

## 2017-04-04 DIAGNOSIS — I129 Hypertensive chronic kidney disease with stage 1 through stage 4 chronic kidney disease, or unspecified chronic kidney disease: Secondary | ICD-10-CM | POA: Diagnosis not present

## 2017-04-04 DIAGNOSIS — Z96651 Presence of right artificial knee joint: Secondary | ICD-10-CM | POA: Diagnosis not present

## 2017-04-04 DIAGNOSIS — M25561 Pain in right knee: Secondary | ICD-10-CM | POA: Diagnosis not present

## 2017-04-04 DIAGNOSIS — K219 Gastro-esophageal reflux disease without esophagitis: Secondary | ICD-10-CM | POA: Diagnosis not present

## 2017-04-07 DIAGNOSIS — Z9181 History of falling: Secondary | ICD-10-CM | POA: Diagnosis not present

## 2017-04-07 DIAGNOSIS — N184 Chronic kidney disease, stage 4 (severe): Secondary | ICD-10-CM | POA: Diagnosis not present

## 2017-04-07 DIAGNOSIS — K219 Gastro-esophageal reflux disease without esophagitis: Secondary | ICD-10-CM | POA: Diagnosis not present

## 2017-04-07 DIAGNOSIS — I129 Hypertensive chronic kidney disease with stage 1 through stage 4 chronic kidney disease, or unspecified chronic kidney disease: Secondary | ICD-10-CM | POA: Diagnosis not present

## 2017-04-07 DIAGNOSIS — M25561 Pain in right knee: Secondary | ICD-10-CM | POA: Diagnosis not present

## 2017-04-07 DIAGNOSIS — Z96651 Presence of right artificial knee joint: Secondary | ICD-10-CM | POA: Diagnosis not present

## 2017-04-14 DIAGNOSIS — K219 Gastro-esophageal reflux disease without esophagitis: Secondary | ICD-10-CM | POA: Diagnosis not present

## 2017-04-14 DIAGNOSIS — N184 Chronic kidney disease, stage 4 (severe): Secondary | ICD-10-CM | POA: Diagnosis not present

## 2017-04-14 DIAGNOSIS — I129 Hypertensive chronic kidney disease with stage 1 through stage 4 chronic kidney disease, or unspecified chronic kidney disease: Secondary | ICD-10-CM | POA: Diagnosis not present

## 2017-04-14 DIAGNOSIS — Z9181 History of falling: Secondary | ICD-10-CM | POA: Diagnosis not present

## 2017-04-14 DIAGNOSIS — Z96651 Presence of right artificial knee joint: Secondary | ICD-10-CM | POA: Diagnosis not present

## 2017-04-14 DIAGNOSIS — M25561 Pain in right knee: Secondary | ICD-10-CM | POA: Diagnosis not present

## 2017-04-16 DIAGNOSIS — K219 Gastro-esophageal reflux disease without esophagitis: Secondary | ICD-10-CM | POA: Diagnosis not present

## 2017-04-16 DIAGNOSIS — Z96651 Presence of right artificial knee joint: Secondary | ICD-10-CM | POA: Diagnosis not present

## 2017-04-16 DIAGNOSIS — Z9181 History of falling: Secondary | ICD-10-CM | POA: Diagnosis not present

## 2017-04-16 DIAGNOSIS — N184 Chronic kidney disease, stage 4 (severe): Secondary | ICD-10-CM | POA: Diagnosis not present

## 2017-04-16 DIAGNOSIS — I129 Hypertensive chronic kidney disease with stage 1 through stage 4 chronic kidney disease, or unspecified chronic kidney disease: Secondary | ICD-10-CM | POA: Diagnosis not present

## 2017-04-16 DIAGNOSIS — M25561 Pain in right knee: Secondary | ICD-10-CM | POA: Diagnosis not present

## 2017-05-21 DIAGNOSIS — Z6823 Body mass index (BMI) 23.0-23.9, adult: Secondary | ICD-10-CM | POA: Diagnosis not present

## 2017-05-21 DIAGNOSIS — B372 Candidiasis of skin and nail: Secondary | ICD-10-CM | POA: Diagnosis not present

## 2017-07-15 DIAGNOSIS — J309 Allergic rhinitis, unspecified: Secondary | ICD-10-CM | POA: Diagnosis not present

## 2017-07-15 DIAGNOSIS — E663 Overweight: Secondary | ICD-10-CM | POA: Diagnosis not present

## 2017-07-15 DIAGNOSIS — H9201 Otalgia, right ear: Secondary | ICD-10-CM | POA: Diagnosis not present

## 2017-07-15 DIAGNOSIS — Z6823 Body mass index (BMI) 23.0-23.9, adult: Secondary | ICD-10-CM | POA: Diagnosis not present

## 2017-08-02 DIAGNOSIS — Z6823 Body mass index (BMI) 23.0-23.9, adult: Secondary | ICD-10-CM | POA: Diagnosis not present

## 2017-08-02 DIAGNOSIS — J309 Allergic rhinitis, unspecified: Secondary | ICD-10-CM | POA: Diagnosis not present

## 2017-08-02 DIAGNOSIS — F419 Anxiety disorder, unspecified: Secondary | ICD-10-CM | POA: Diagnosis not present

## 2017-08-02 DIAGNOSIS — G4700 Insomnia, unspecified: Secondary | ICD-10-CM | POA: Diagnosis not present

## 2017-08-02 DIAGNOSIS — I10 Essential (primary) hypertension: Secondary | ICD-10-CM | POA: Diagnosis not present

## 2017-08-02 DIAGNOSIS — R079 Chest pain, unspecified: Secondary | ICD-10-CM | POA: Diagnosis not present

## 2017-08-05 DIAGNOSIS — F419 Anxiety disorder, unspecified: Secondary | ICD-10-CM

## 2017-08-05 DIAGNOSIS — I714 Abdominal aortic aneurysm, without rupture, unspecified: Secondary | ICD-10-CM

## 2017-08-05 DIAGNOSIS — G4700 Insomnia, unspecified: Secondary | ICD-10-CM

## 2017-08-05 DIAGNOSIS — E538 Deficiency of other specified B group vitamins: Secondary | ICD-10-CM

## 2017-08-05 DIAGNOSIS — R079 Chest pain, unspecified: Secondary | ICD-10-CM

## 2017-08-05 DIAGNOSIS — J309 Allergic rhinitis, unspecified: Secondary | ICD-10-CM

## 2017-08-05 DIAGNOSIS — Q210 Ventricular septal defect: Secondary | ICD-10-CM | POA: Insufficient documentation

## 2017-08-05 DIAGNOSIS — K219 Gastro-esophageal reflux disease without esophagitis: Secondary | ICD-10-CM

## 2017-08-05 DIAGNOSIS — E875 Hyperkalemia: Secondary | ICD-10-CM

## 2017-08-05 DIAGNOSIS — M810 Age-related osteoporosis without current pathological fracture: Secondary | ICD-10-CM

## 2017-08-05 DIAGNOSIS — N189 Chronic kidney disease, unspecified: Secondary | ICD-10-CM | POA: Insufficient documentation

## 2017-08-05 HISTORY — DX: Abdominal aortic aneurysm, without rupture, unspecified: I71.40

## 2017-08-05 HISTORY — DX: Abdominal aortic aneurysm, without rupture: I71.4

## 2017-08-05 HISTORY — DX: Anxiety disorder, unspecified: F41.9

## 2017-08-05 HISTORY — DX: Deficiency of other specified B group vitamins: E53.8

## 2017-08-05 HISTORY — DX: Insomnia, unspecified: G47.00

## 2017-08-05 HISTORY — DX: Ventricular septal defect: Q21.0

## 2017-08-05 HISTORY — DX: Hyperkalemia: E87.5

## 2017-08-05 HISTORY — DX: Age-related osteoporosis without current pathological fracture: M81.0

## 2017-08-05 HISTORY — DX: Chest pain, unspecified: R07.9

## 2017-08-05 HISTORY — DX: Gastro-esophageal reflux disease without esophagitis: K21.9

## 2017-08-05 HISTORY — DX: Allergic rhinitis, unspecified: J30.9

## 2017-08-10 NOTE — Progress Notes (Signed)
Cardiology Office Note:    Date:  08/12/2017   ID:  Jennifer Buchanan, DOB 1933-02-28, MRN [id number]  PCP:  Judithann Graves, MD  Cardiologist:  Shirlee More, MD   Referring MD: Nicholos Johns, MD  ASSESSMENT:    1. Chest pain, unspecified type   2. Essential hypertension    PLAN:    In order of problems listed above:  1. Symptoms are atypical after discussion with patient and daughter we decided to place her back on low-dose aspirin that have been stopped after orthopedic surgery resume taking her oral nitrate use nitroglycerin as needed and if symptoms regress at this time not to pursue an ischemic evaluation as her overall health and comorbidities make her a poor candidate for elective revascularization.  If the symptoms typify progress she will require further evaluation.  Troponin assessed in the office today and if abnormal will need to be treated as acute coronary syndrome 2. Stable continue current treatment  Follow-up in 3 months     Medication Adjustments/Labs and Tests Ordered: Current medicines are reviewed at length with the patient today.  Concerns regarding medicines are outlined above.  Orders Placed This Encounter  Procedures  . Troponin I   Meds ordered this encounter  Medications  . aspirin EC 81 MG tablet    Sig: Take 1 tablet (81 mg total) by mouth daily.    Dispense:  90 tablet    Refill:  3     Chief Complaint  Patient presents with  . Chest Pain    History of Present Illness:    Jennifer Buchanan is a 84 y.o. female with stroke in 1995, HTN, PFO with surgical closure who is being seen today for the evaluation of chest pain at the request of Nicholos Johns, MD.  I seen her in the past but I records are unavailable.  Records from my previous practice in 2017 detail that she had a atrial communication that was surgically closed in a form of cardiac aneurysm that was corrected I suspect it was atrial septal.  She does not have a history of bypass  surgery.  She is referred to me as recently she has been complaining of nonexertional intermittent chest pain.  The symptoms are vaguely happen several times a day she has a prescription for nitroglycerin she has not used it and she has been noncompliant with her medications.  In some fashion a low-dose aspirin that she took in the past has been diminished in all nitrates that she has a prescription for have not been taken.  She is quite frail I spoke with her family member she is a poor candidate for cardiac revascularization and I have asked her to resume low-dose aspirin take oral nitrates if symptoms are controlled at this time I will hold on further evaluation for ischemic heart disease if she has ongoing or worsening symptoms should require further evaluation.  She has edema but only takes her diuretic sporadically she has had palpitation not recently no syncope or TIA.  An echocardiogram was performed in 2017 in my previous practice the results are unavailable recent EKG was unremarkable his symptoms are not exertional or relieved spontaneous and are very brief lasting seconds to minutes and occurring several times per day she describes a discomfort in her left chest it seems to be focal localized but she has no chest wall tenderness it is not pleuritic in nature and she is not short of breath. Past Medical History:  Diagnosis Date  .  AAA (abdominal aortic aneurysm) ([place]) 08/05/2017  . Allergic rhinitis 08/05/2017  . Anxiety 08/05/2017  . B12 deficiency 08/05/2017  . Cerebrovascular accident (CVA) due to embolism ([place]) 06/27/2015  . Chest pain 08/05/2017  . GERD (gastroesophageal reflux disease) 08/05/2017  . H/O heart surgery    aneurysm and hole in heart  . HTN (hypertension) 06/26/2015  . Hyperkalemia 08/05/2017  . Hypertension   . Insomnia 08/05/2017  . Neuropathy 06/26/2015  . Osteoporosis 08/05/2017  . Pre-operative cardiovascular examination 06/27/2015  . Unsteady gait 06/26/2015  . VSD (ventricular  septal defect) 08/05/2017    Past Surgical History:  Procedure Laterality Date  . ABDOMINAL HYSTERECTOMY    . CARDIAC SURGERY     s/p AAA  . CATARACT EXTRACTION Bilateral   . CHOLECYSTECTOMY      Current Medications: Current Meds  Medication Sig  . amLODipine (NORVASC) 10 MG tablet Take 10 mg by mouth daily.  Marland Kitchen atenolol (TENORMIN) 50 MG tablet Take 25 mg by mouth daily.   . furosemide (LASIX) 20 MG tablet Take 20 mg by mouth daily as needed. Swelling in feet  . isosorbide mononitrate (IMDUR) 60 MG 24 hr tablet Take 60 mg by mouth daily.  . Lactobacillus Rhamnosus, GG, (CULTURELLE PO) Take by mouth daily.  . nitroGLYCERIN (NITROSTAT) 0.4 MG SL tablet TAKE 1 TABLET UNDER TH TONGUE FOR CHEST PAIN EVERY 5MIN (X3 TIMES) IF NOT BETTER GO TO ER  . omeprazole (PRILOSEC) 20 MG capsule Take 20 mg by mouth daily.  . sertraline (ZOLOFT) 50 MG tablet TAKE 1 TABLET AT BEDTIME FOR MOODS     Allergies:   Metoclopramide and Demerol [meperidine]   Social History   Socioeconomic History  . Marital status: Married    Spouse name: Not on file  . Number of children: Not on file  . Years of education: Not on file  . Highest education level: Not on file  Occupational History  . Not on file  Social Needs  . Financial resource strain: Not on file  . Food insecurity:    Worry: Not on file    Inability: Not on file  . Transportation needs:    Medical: Not on file    Non-medical: Not on file  Tobacco Use  . Smoking status: Never Smoker  . Smokeless tobacco: Never Used  Substance and Sexual Activity  . Alcohol use: No  . Drug use: No  . Sexual activity: Not on file  Lifestyle  . Physical activity:    Days per week: Not on file    Minutes per session: Not on file  . Stress: Not on file  Relationships  . Social connections:    Talks on phone: Not on file    Gets together: Not on file    Attends religious service: Not on file    Active member of club or organization: Not on file     Attends meetings of clubs or organizations: Not on file    Relationship status: Not on file  Other Topics Concern  . Not on file  Social History Narrative  . Not on file     Family History: The patient's family history includes Heart attack in her brother, mother, and sister; Hypertension in her mother.  ROS:   Review of Systems  Constitution: Positive for malaise/fatigue.  HENT: Negative.   Eyes: Negative.   Cardiovascular: Positive for chest pain and leg swelling.  Respiratory: Negative.   Endocrine: Negative.   Hematologic/Lymphatic: Negative.   Skin: Negative.  Musculoskeletal: Positive for myalgias.  Gastrointestinal: Positive for diarrhea, nausea and vomiting.  Genitourinary: Negative.   Neurological: Negative.   Psychiatric/Behavioral: Negative.   Allergic/Immunologic: Negative.    Please see the history of present illness.     All other systems reviewed and are negative.  EKGs/Labs/Other Studies Reviewed:    The following studies were reviewed today: On records from her PCP reviewed prior to the visit  EKG:  EKG 08/02/17.  demonstrates [facility] normal  Recent Labs: No results found for requested labs within last 8760 hours.  Recent Lipid Panel No results found for: CHOL, TRIG, HDL, CHOLHDL, VLDL, LDLCALC, LDLDIRECT  Physical Exam:    VS:  BP (!) 180/90 (BP Location: Left Arm, Patient Position: Sitting, Cuff Size: Normal)   Pulse (!) 55   Ht 5' (1.524 m)   Wt 123 lb (55.8 kg)   SpO2 98%   BMI 24.02 kg/m     Wt Readings from Last 3 Encounters:  08/12/17 123 lb (55.8 kg)     GEN: frail appearance  in no acute distress HEENT: Normal NECK: No JVD; No carotid bruits LYMPHATICS: No lymphadenopathy CARDIAC: No chest wall tenderness RRR, no murmurs, rubs, gallops RESPIRATORY:  Clear to auscultation without rales, wheezing or rhonchi  ABDOMEN: Soft, non-tender, non-distended MUSCULOSKELETAL:  1-2+ ankle edema; No deformity  SKIN: Warm and dry NEUROLOGIC:   Alert and oriented x 3 PSYCHIATRIC:  Normal affect     Signed, Shirlee More, MD  08/12/2017 5:55 PM    [place]

## 2017-08-12 ENCOUNTER — Ambulatory Visit (INDEPENDENT_AMBULATORY_CARE_PROVIDER_SITE_OTHER): Payer: Medicare HMO | Admitting: Cardiology

## 2017-08-12 ENCOUNTER — Encounter: Payer: Self-pay | Admitting: Cardiology

## 2017-08-12 VITALS — BP 180/90 | HR 55 | Ht 60.0 in | Wt 123.0 lb

## 2017-08-12 DIAGNOSIS — I2510 Atherosclerotic heart disease of native coronary artery without angina pectoris: Secondary | ICD-10-CM

## 2017-08-12 DIAGNOSIS — R079 Chest pain, unspecified: Secondary | ICD-10-CM | POA: Diagnosis not present

## 2017-08-12 DIAGNOSIS — I10 Essential (primary) hypertension: Secondary | ICD-10-CM

## 2017-08-12 MED ORDER — ASPIRIN EC 81 MG PO TBEC: 81.0000 mg | DELAYED_RELEASE_TABLET | Freq: Every day | ORAL | 3 refills | Status: DC

## 2017-08-12 NOTE — Patient Instructions (Signed)
Medication Instructions:  Your physician has recommended you make the following change in your medication:  START aspirin enteric coated 81 mg daily START isosorbide (Imdur) 60 mg daily  Labwork: Your physician recommends that you return for lab work in: troponin  Testing/Procedures: None  Follow-Up: Your physician wants you to follow-up in: 3 months. You will receive a reminder letter in the mail two months in advance. If you don't receive a letter, please call our office to schedule the follow-up appointment.  Any Other Special Instructions Will Be Listed Below (If Applicable).     If you need a refill on your cardiac medications before your next appointment, please call your pharmacy.

## 2017-08-13 LAB — SPECIMEN STATUS REPORT

## 2017-08-13 LAB — TROPONIN I: Troponin I: <0.01 ng/mL (ref 0.00–0.04)

## 2017-08-31 DIAGNOSIS — D649 Anemia, unspecified: Secondary | ICD-10-CM | POA: Diagnosis not present

## 2017-08-31 DIAGNOSIS — Z6823 Body mass index (BMI) 23.0-23.9, adult: Secondary | ICD-10-CM | POA: Diagnosis not present

## 2017-08-31 DIAGNOSIS — F419 Anxiety disorder, unspecified: Secondary | ICD-10-CM | POA: Diagnosis not present

## 2017-08-31 DIAGNOSIS — I10 Essential (primary) hypertension: Secondary | ICD-10-CM | POA: Diagnosis not present

## 2017-08-31 DIAGNOSIS — R609 Edema, unspecified: Secondary | ICD-10-CM | POA: Diagnosis not present

## 2017-08-31 DIAGNOSIS — E785 Hyperlipidemia, unspecified: Secondary | ICD-10-CM | POA: Diagnosis not present

## 2017-08-31 DIAGNOSIS — Z9181 History of falling: Secondary | ICD-10-CM | POA: Diagnosis not present

## 2017-08-31 DIAGNOSIS — Z1331 Encounter for screening for depression: Secondary | ICD-10-CM | POA: Diagnosis not present

## 2017-08-31 DIAGNOSIS — E559 Vitamin D deficiency, unspecified: Secondary | ICD-10-CM | POA: Diagnosis not present

## 2017-08-31 DIAGNOSIS — Z79899 Other long term (current) drug therapy: Secondary | ICD-10-CM | POA: Diagnosis not present

## 2017-08-31 DIAGNOSIS — N184 Chronic kidney disease, stage 4 (severe): Secondary | ICD-10-CM | POA: Diagnosis not present

## 2017-09-20 DIAGNOSIS — Z6823 Body mass index (BMI) 23.0-23.9, adult: Secondary | ICD-10-CM | POA: Diagnosis not present

## 2017-09-20 DIAGNOSIS — R197 Diarrhea, unspecified: Secondary | ICD-10-CM | POA: Diagnosis not present

## 2017-09-20 DIAGNOSIS — Z1339 Encounter for screening examination for other mental health and behavioral disorders: Secondary | ICD-10-CM | POA: Diagnosis not present

## 2017-09-21 DIAGNOSIS — R197 Diarrhea, unspecified: Secondary | ICD-10-CM | POA: Diagnosis not present

## 2017-11-22 DIAGNOSIS — S8991XA Unspecified injury of right lower leg, initial encounter: Secondary | ICD-10-CM | POA: Diagnosis not present

## 2017-11-22 DIAGNOSIS — I129 Hypertensive chronic kidney disease with stage 1 through stage 4 chronic kidney disease, or unspecified chronic kidney disease: Secondary | ICD-10-CM | POA: Diagnosis not present

## 2017-11-22 DIAGNOSIS — K529 Noninfective gastroenteritis and colitis, unspecified: Secondary | ICD-10-CM | POA: Diagnosis not present

## 2017-11-22 DIAGNOSIS — R531 Weakness: Secondary | ICD-10-CM | POA: Diagnosis not present

## 2017-11-22 DIAGNOSIS — R29898 Other symptoms and signs involving the musculoskeletal system: Secondary | ICD-10-CM | POA: Diagnosis not present

## 2017-11-22 DIAGNOSIS — I10 Essential (primary) hypertension: Secondary | ICD-10-CM | POA: Diagnosis not present

## 2017-11-22 DIAGNOSIS — N183 Chronic kidney disease, stage 3 (moderate): Secondary | ICD-10-CM | POA: Diagnosis not present

## 2017-11-22 DIAGNOSIS — R918 Other nonspecific abnormal finding of lung field: Secondary | ICD-10-CM | POA: Diagnosis not present

## 2017-11-22 DIAGNOSIS — R52 Pain, unspecified: Secondary | ICD-10-CM | POA: Diagnosis not present

## 2017-11-22 DIAGNOSIS — R200 Anesthesia of skin: Secondary | ICD-10-CM | POA: Diagnosis not present

## 2017-11-22 DIAGNOSIS — G459 Transient cerebral ischemic attack, unspecified: Secondary | ICD-10-CM | POA: Diagnosis not present

## 2017-11-22 DIAGNOSIS — R29818 Other symptoms and signs involving the nervous system: Secondary | ICD-10-CM | POA: Diagnosis not present

## 2017-11-22 DIAGNOSIS — Z751 Person awaiting admission to adequate facility elsewhere: Secondary | ICD-10-CM | POA: Diagnosis not present

## 2017-11-22 DIAGNOSIS — F329 Major depressive disorder, single episode, unspecified: Secondary | ICD-10-CM | POA: Diagnosis not present

## 2017-11-22 DIAGNOSIS — R0602 Shortness of breath: Secondary | ICD-10-CM | POA: Diagnosis not present

## 2017-11-22 DIAGNOSIS — M79604 Pain in right leg: Secondary | ICD-10-CM | POA: Diagnosis not present

## 2017-11-23 DIAGNOSIS — I6789 Other cerebrovascular disease: Secondary | ICD-10-CM | POA: Diagnosis not present

## 2017-11-23 DIAGNOSIS — I10 Essential (primary) hypertension: Secondary | ICD-10-CM | POA: Diagnosis not present

## 2017-11-23 DIAGNOSIS — R200 Anesthesia of skin: Secondary | ICD-10-CM | POA: Diagnosis not present

## 2017-11-23 DIAGNOSIS — G459 Transient cerebral ischemic attack, unspecified: Secondary | ICD-10-CM | POA: Diagnosis not present

## 2017-11-23 DIAGNOSIS — R29898 Other symptoms and signs involving the musculoskeletal system: Secondary | ICD-10-CM | POA: Diagnosis not present

## 2017-11-23 DIAGNOSIS — I6523 Occlusion and stenosis of bilateral carotid arteries: Secondary | ICD-10-CM | POA: Diagnosis not present

## 2017-11-23 DIAGNOSIS — N183 Chronic kidney disease, stage 3 (moderate): Secondary | ICD-10-CM | POA: Diagnosis not present

## 2017-11-24 DIAGNOSIS — K529 Noninfective gastroenteritis and colitis, unspecified: Secondary | ICD-10-CM | POA: Diagnosis not present

## 2017-11-24 DIAGNOSIS — R531 Weakness: Secondary | ICD-10-CM | POA: Diagnosis not present

## 2017-11-24 DIAGNOSIS — G459 Transient cerebral ischemic attack, unspecified: Secondary | ICD-10-CM | POA: Diagnosis not present

## 2017-11-25 DIAGNOSIS — G459 Transient cerebral ischemic attack, unspecified: Secondary | ICD-10-CM | POA: Diagnosis not present

## 2017-11-25 DIAGNOSIS — R531 Weakness: Secondary | ICD-10-CM | POA: Diagnosis not present

## 2017-11-25 DIAGNOSIS — K529 Noninfective gastroenteritis and colitis, unspecified: Secondary | ICD-10-CM | POA: Diagnosis not present

## 2017-11-26 DIAGNOSIS — R531 Weakness: Secondary | ICD-10-CM | POA: Diagnosis not present

## 2017-11-26 DIAGNOSIS — G459 Transient cerebral ischemic attack, unspecified: Secondary | ICD-10-CM | POA: Diagnosis not present

## 2017-11-26 DIAGNOSIS — K529 Noninfective gastroenteritis and colitis, unspecified: Secondary | ICD-10-CM | POA: Diagnosis not present

## 2017-11-27 DIAGNOSIS — K529 Noninfective gastroenteritis and colitis, unspecified: Secondary | ICD-10-CM | POA: Diagnosis not present

## 2017-11-27 DIAGNOSIS — R531 Weakness: Secondary | ICD-10-CM | POA: Diagnosis not present

## 2017-11-27 DIAGNOSIS — G459 Transient cerebral ischemic attack, unspecified: Secondary | ICD-10-CM | POA: Diagnosis not present

## 2017-11-28 DIAGNOSIS — K529 Noninfective gastroenteritis and colitis, unspecified: Secondary | ICD-10-CM | POA: Diagnosis not present

## 2017-11-28 DIAGNOSIS — G459 Transient cerebral ischemic attack, unspecified: Secondary | ICD-10-CM | POA: Diagnosis not present

## 2017-11-28 DIAGNOSIS — R531 Weakness: Secondary | ICD-10-CM | POA: Diagnosis not present

## 2017-11-29 DIAGNOSIS — R531 Weakness: Secondary | ICD-10-CM | POA: Diagnosis not present

## 2017-11-29 DIAGNOSIS — I6789 Other cerebrovascular disease: Secondary | ICD-10-CM | POA: Diagnosis not present

## 2017-11-29 DIAGNOSIS — G459 Transient cerebral ischemic attack, unspecified: Secondary | ICD-10-CM | POA: Diagnosis not present

## 2017-11-29 DIAGNOSIS — K529 Noninfective gastroenteritis and colitis, unspecified: Secondary | ICD-10-CM | POA: Diagnosis not present

## 2017-11-30 DIAGNOSIS — E785 Hyperlipidemia, unspecified: Secondary | ICD-10-CM | POA: Diagnosis not present

## 2017-11-30 DIAGNOSIS — I129 Hypertensive chronic kidney disease with stage 1 through stage 4 chronic kidney disease, or unspecified chronic kidney disease: Secondary | ICD-10-CM | POA: Diagnosis not present

## 2017-11-30 DIAGNOSIS — R0602 Shortness of breath: Secondary | ICD-10-CM | POA: Diagnosis not present

## 2017-11-30 DIAGNOSIS — H9193 Unspecified hearing loss, bilateral: Secondary | ICD-10-CM | POA: Diagnosis not present

## 2017-11-30 DIAGNOSIS — K219 Gastro-esophageal reflux disease without esophagitis: Secondary | ICD-10-CM | POA: Diagnosis not present

## 2017-11-30 DIAGNOSIS — R200 Anesthesia of skin: Secondary | ICD-10-CM | POA: Diagnosis not present

## 2017-11-30 DIAGNOSIS — R29898 Other symptoms and signs involving the musculoskeletal system: Secondary | ICD-10-CM | POA: Diagnosis not present

## 2017-11-30 DIAGNOSIS — I69351 Hemiplegia and hemiparesis following cerebral infarction affecting right dominant side: Secondary | ICD-10-CM | POA: Diagnosis not present

## 2017-11-30 DIAGNOSIS — F322 Major depressive disorder, single episode, severe without psychotic features: Secondary | ICD-10-CM | POA: Diagnosis not present

## 2017-11-30 DIAGNOSIS — Z751 Person awaiting admission to adequate facility elsewhere: Secondary | ICD-10-CM | POA: Diagnosis not present

## 2017-11-30 DIAGNOSIS — R197 Diarrhea, unspecified: Secondary | ICD-10-CM | POA: Diagnosis not present

## 2017-11-30 DIAGNOSIS — K529 Noninfective gastroenteritis and colitis, unspecified: Secondary | ICD-10-CM | POA: Diagnosis not present

## 2017-11-30 DIAGNOSIS — N183 Chronic kidney disease, stage 3 (moderate): Secondary | ICD-10-CM | POA: Diagnosis not present

## 2017-11-30 DIAGNOSIS — Z7401 Bed confinement status: Secondary | ICD-10-CM | POA: Diagnosis not present

## 2017-11-30 DIAGNOSIS — M2550 Pain in unspecified joint: Secondary | ICD-10-CM | POA: Diagnosis not present

## 2017-11-30 DIAGNOSIS — I6789 Other cerebrovascular disease: Secondary | ICD-10-CM | POA: Diagnosis not present

## 2017-11-30 DIAGNOSIS — G459 Transient cerebral ischemic attack, unspecified: Secondary | ICD-10-CM | POA: Diagnosis not present

## 2017-11-30 DIAGNOSIS — R52 Pain, unspecified: Secondary | ICD-10-CM | POA: Diagnosis not present

## 2017-11-30 DIAGNOSIS — R531 Weakness: Secondary | ICD-10-CM | POA: Diagnosis not present

## 2017-11-30 DIAGNOSIS — I10 Essential (primary) hypertension: Secondary | ICD-10-CM | POA: Diagnosis not present

## 2017-11-30 DIAGNOSIS — F329 Major depressive disorder, single episode, unspecified: Secondary | ICD-10-CM | POA: Diagnosis not present

## 2017-12-04 DIAGNOSIS — F322 Major depressive disorder, single episode, severe without psychotic features: Secondary | ICD-10-CM | POA: Diagnosis not present

## 2017-12-04 DIAGNOSIS — N183 Chronic kidney disease, stage 3 (moderate): Secondary | ICD-10-CM | POA: Diagnosis not present

## 2017-12-04 DIAGNOSIS — K219 Gastro-esophageal reflux disease without esophagitis: Secondary | ICD-10-CM | POA: Diagnosis not present

## 2017-12-04 DIAGNOSIS — G459 Transient cerebral ischemic attack, unspecified: Secondary | ICD-10-CM | POA: Diagnosis not present

## 2017-12-10 DIAGNOSIS — I10 Essential (primary) hypertension: Secondary | ICD-10-CM | POA: Diagnosis not present

## 2017-12-10 DIAGNOSIS — N183 Chronic kidney disease, stage 3 (moderate): Secondary | ICD-10-CM | POA: Diagnosis not present

## 2017-12-10 DIAGNOSIS — R197 Diarrhea, unspecified: Secondary | ICD-10-CM | POA: Diagnosis not present

## 2017-12-10 DIAGNOSIS — G459 Transient cerebral ischemic attack, unspecified: Secondary | ICD-10-CM | POA: Diagnosis not present

## 2017-12-10 DIAGNOSIS — F322 Major depressive disorder, single episode, severe without psychotic features: Secondary | ICD-10-CM | POA: Diagnosis not present

## 2017-12-10 DIAGNOSIS — K219 Gastro-esophageal reflux disease without esophagitis: Secondary | ICD-10-CM | POA: Diagnosis not present

## 2017-12-10 DIAGNOSIS — H9193 Unspecified hearing loss, bilateral: Secondary | ICD-10-CM | POA: Diagnosis not present

## 2017-12-11 DIAGNOSIS — I129 Hypertensive chronic kidney disease with stage 1 through stage 4 chronic kidney disease, or unspecified chronic kidney disease: Secondary | ICD-10-CM | POA: Diagnosis not present

## 2017-12-11 DIAGNOSIS — D509 Iron deficiency anemia, unspecified: Secondary | ICD-10-CM | POA: Diagnosis not present

## 2017-12-11 DIAGNOSIS — I69351 Hemiplegia and hemiparesis following cerebral infarction affecting right dominant side: Secondary | ICD-10-CM | POA: Diagnosis not present

## 2017-12-11 DIAGNOSIS — I4891 Unspecified atrial fibrillation: Secondary | ICD-10-CM | POA: Diagnosis not present

## 2017-12-11 DIAGNOSIS — N183 Chronic kidney disease, stage 3 (moderate): Secondary | ICD-10-CM | POA: Diagnosis not present

## 2017-12-11 DIAGNOSIS — K529 Noninfective gastroenteritis and colitis, unspecified: Secondary | ICD-10-CM | POA: Diagnosis not present

## 2017-12-12 NOTE — Progress Notes (Signed)
Cardiology Office Note:    Date:  12/13/2017   ID:  Jennifer Buchanan, DOB 1933/03/06, MRN [id number]  PCP:  Judithann Graves, MD  Cardiologist:  Shirlee More, MD    Referring MD: Judithann Graves, MD    ASSESSMENT:    1. TIA (transient ischemic attack)   2. Bradycardia with 41-50 beats per minute   3. VSD (ventricular septal defect)   4. Essential hypertension   5. CAD in native artery    PLAN:    In order of problems listed above:  1. She had a very typical dominant hemispheric TIA further evaluation with a one-month external loop recorder and contrast echo.  If atrial fibrillation documented will require anticoagulant therapy 2. Asymptomatic junctional rhythm beta-blocker discontinued 3. .  Likely in my opinion is small VSDs given prominent physical findings and loud murmur she has none on physical exam 4. Stable continue current treatment 5. Stable continue current treatment at this time I would not pursue an ischemia evaluation 6. Stable's hyperlipidemia recently started on a statin I would continue the same   Next appointment: 2 months   Medication Adjustments/Labs and Tests Ordered: Current medicines are reviewed at length with the patient today.  Concerns regarding medicines are outlined above.  Orders Placed This Encounter  Procedures  . LONG TERM MONITOR (3-14 DAYS)  . LONG TERM MONITOR (3-14 DAYS)  . ECHOCARDIOGRAM COMPLETE BUBBLE STUDY   Meds ordered this encounter  Medications  . aspirin EC 81 MG tablet    Sig: Take 1 tablet (81 mg total) by mouth daily.    Dispense:  90 tablet    Refill:  3    Chief Complaint  Patient presents with  . Follow-up    after RH admission    History of Present Illness:    Jennifer Buchanan is a 84 y.o. female with a hx of stroke in 1995, HTN, PFO with surgical closure.  She is seen at [facility] and admitted 11/22/2017 she is felt to have had a TIA.  During the hospitalization rhythm was sinus did not exhibit  atrial fibrillation echocardiogram was unremarkable she is felt to have a tiny ventricular septal defect no significant valvular abnormality normal left ventricular function neurology saw her recommend that she be evaluated as an outpatient for paroxysmal atrial fibrillation she is mildly anemic hemoglobin 11.2 mild renal dysfunction creatinine 1.2 estimated GFR 48 cc carotid duplex showed minimal plaque at the level of the bulb bilaterally.  last seen 82/16/19 for chest pain evaluation. Compliance with diet, lifestyle and medications: Yes  Patient describes very typical TIA with right-sided weakness facial weakness and asymmetry and dysarthria that cleared spontaneously.  MRI of the brain did not show stroke while in hospital she had a junctional rhythm at 48 she takes a beta-blocker no aspirin be discontinued but no documented atrial fibrillation.  Family is advised to seek follow-up with cardiology regarding cardiac etiology in view of her previous surgically closed patent foramen ovale.  Her echocardiogram suggested a small VSD but she has no murmur on exam making that physical finding very unlikely.  I met with the patient and her son who is a EMT and we reviewed a further evaluation including one reducing aspirin 81 mg daily to 1 month external loop recorder to screen for atrial fibrillation 3 discontinue beta-blocker with junctional rhythm in the hospital and for echocardiogram with contrast injection to screen for residual shunt.  If abnormal she will need a transesophageal echocardiogram.  She has had no recurrent neurologic symptoms since discharge from the hospital no chest pain shortness of breath palpitation or syncope Past Medical History:  Diagnosis Date  . AAA (abdominal aortic aneurysm) ([place]) 08/05/2017  . Allergic rhinitis 08/05/2017  . Anxiety 08/05/2017  . B12 deficiency 08/05/2017  . Cerebrovascular accident (CVA) due to embolism ([place]) 06/27/2015  . Chest pain 08/05/2017  . GERD  (gastroesophageal reflux disease) 08/05/2017  . H/O heart surgery    aneurysm and hole in heart  . HTN (hypertension) 06/26/2015  . Hyperkalemia 08/05/2017  . Hypertension   . Insomnia 08/05/2017  . Neuropathy 06/26/2015  . Osteoporosis 08/05/2017  . Pre-operative cardiovascular examination 06/27/2015  . Unsteady gait 06/26/2015  . VSD (ventricular septal defect) 08/05/2017    Past Surgical History:  Procedure Laterality Date  . ABDOMINAL HYSTERECTOMY    . CARDIAC SURGERY     s/p AAA  . CATARACT EXTRACTION Bilateral   . CHOLECYSTECTOMY      Current Medications: Current Meds  Medication Sig  . acetaminophen (ACETAMINOPHEN EXTRA STRENGTH) 500 MG tablet Take 1,000 mg by mouth every 6 (six) hours as needed.  Marland Kitchen amLODipine (NORVASC) 10 MG tablet Take 10 mg by mouth daily.  Marland Kitchen atenolol (TENORMIN) 25 MG tablet Take 25 mg by mouth daily.   Marland Kitchen atorvastatin (LIPITOR) 40 MG tablet Take 40 mg by mouth daily.  . calcium carbonate (TUMS) 500 MG chewable tablet Chew 2 tablets by mouth every 6 (six) hours as needed for indigestion or heartburn.  . isosorbide mononitrate (IMDUR) 60 MG 24 hr tablet Take 60 mg by mouth daily.  Marland Kitchen loperamide (IMODIUM A-D) 2 MG tablet Take 2 mg by mouth 4 (four) times daily as needed for diarrhea or loose stools.  . nitroGLYCERIN (NITROSTAT) 0.4 MG SL tablet TAKE 1 TABLET UNDER TH TONGUE FOR CHEST PAIN EVERY 5MIN (X3 TIMES) IF NOT BETTER GO TO ER  . omeprazole (PRILOSEC) 20 MG capsule Take 20 mg by mouth 2 (two) times daily before a meal.   . [DISCONTINUED] aspirin 325 MG tablet Take 325 mg by mouth daily.     Allergies:   Metoclopramide and Demerol [meperidine]   Social History   Socioeconomic History  . Marital status: Married    Spouse name: Not on file  . Number of children: Not on file  . Years of education: Not on file  . Highest education level: Not on file  Occupational History  . Not on file  Social Needs  . Financial resource strain: Not on file  . Food  insecurity:    Worry: Not on file    Inability: Not on file  . Transportation needs:    Medical: Not on file    Non-medical: Not on file  Tobacco Use  . Smoking status: Never Smoker  . Smokeless tobacco: Never Used  Substance and Sexual Activity  . Alcohol use: No  . Drug use: No  . Sexual activity: Not on file  Lifestyle  . Physical activity:    Days per week: Not on file    Minutes per session: Not on file  . Stress: Not on file  Relationships  . Social connections:    Talks on phone: Not on file    Gets together: Not on file    Attends religious service: Not on file    Active member of club or organization: Not on file    Attends meetings of clubs or organizations: Not on file    Relationship status: Not  on file  Other Topics Concern  . Not on file  Social History Narrative  . Not on file     Family History: The patient's family history includes Heart attack in her brother, mother, and sister; Hypertension in her mother. ROS:   Please see the history of present illness.    All other systems reviewed and are negative.  EKGs/Labs/Other Studies Reviewed:    The following studies were reviewed today:  EKG:  EKG from [facility] reviewed on the first she was in a junctional rhythm at 48 beats per minute and the second showed sinus rhythm with APCs  Recent Labs: No results found for requested labs within last 8760 hours.  Recent Lipid Panel No results found for: CHOL, TRIG, HDL, CHOLHDL, VLDL, LDLCALC, LDLDIRECT  Physical Exam:    VS:  BP 134/64 (BP Location: Right Arm, Patient Position: Sitting, Cuff Size: Normal)   Pulse 60   Ht 5' (1.524 m)   Wt 124 lb (56.2 kg)   SpO2 96%   BMI 24.22 kg/m     Wt Readings from Last 3 Encounters:  12/13/17 124 lb (56.2 kg)  08/12/17 123 lb (55.8 kg)     GEN:  Well nourished, well developed in no acute distress HEENT: Normal NECK: No JVD; No carotid bruits LYMPHATICS: No lymphadenopathy CARDIAC: She has no  murmur on exam RRR, no murmurs, rubs, gallops RESPIRATORY:  Clear to auscultation without rales, wheezing or rhonchi  ABDOMEN: Soft, non-tender, non-distended MUSCULOSKELETAL:  No edema; No deformity  SKIN: Warm and dry NEUROLOGIC:  Alert and oriented x 3 PSYCHIATRIC:  Normal affect    Signed, Shirlee More, MD  12/13/2017 4:38 PM    [facility]

## 2017-12-13 ENCOUNTER — Ambulatory Visit (INDEPENDENT_AMBULATORY_CARE_PROVIDER_SITE_OTHER): Payer: Medicare HMO | Admitting: Cardiology

## 2017-12-13 ENCOUNTER — Encounter: Payer: Self-pay | Admitting: *Deleted

## 2017-12-13 VITALS — BP 134/64 | HR 60 | Ht 60.0 in | Wt 124.0 lb

## 2017-12-13 DIAGNOSIS — R001 Bradycardia, unspecified: Secondary | ICD-10-CM

## 2017-12-13 DIAGNOSIS — Q210 Ventricular septal defect: Secondary | ICD-10-CM

## 2017-12-13 DIAGNOSIS — G459 Transient cerebral ischemic attack, unspecified: Secondary | ICD-10-CM | POA: Diagnosis not present

## 2017-12-13 DIAGNOSIS — N183 Chronic kidney disease, stage 3 (moderate): Secondary | ICD-10-CM | POA: Diagnosis not present

## 2017-12-13 DIAGNOSIS — I2510 Atherosclerotic heart disease of native coronary artery without angina pectoris: Secondary | ICD-10-CM

## 2017-12-13 DIAGNOSIS — I10 Essential (primary) hypertension: Secondary | ICD-10-CM | POA: Diagnosis not present

## 2017-12-13 DIAGNOSIS — K529 Noninfective gastroenteritis and colitis, unspecified: Secondary | ICD-10-CM | POA: Diagnosis not present

## 2017-12-13 DIAGNOSIS — I4891 Unspecified atrial fibrillation: Secondary | ICD-10-CM | POA: Diagnosis not present

## 2017-12-13 DIAGNOSIS — I69351 Hemiplegia and hemiparesis following cerebral infarction affecting right dominant side: Secondary | ICD-10-CM | POA: Diagnosis not present

## 2017-12-13 DIAGNOSIS — D509 Iron deficiency anemia, unspecified: Secondary | ICD-10-CM | POA: Diagnosis not present

## 2017-12-13 DIAGNOSIS — I129 Hypertensive chronic kidney disease with stage 1 through stage 4 chronic kidney disease, or unspecified chronic kidney disease: Secondary | ICD-10-CM | POA: Diagnosis not present

## 2017-12-13 MED ORDER — ASPIRIN EC 81 MG PO TBEC: 81.0000 mg | DELAYED_RELEASE_TABLET | Freq: Every day | ORAL | 3 refills | Status: DC

## 2017-12-13 NOTE — Patient Instructions (Addendum)
Medication Instructions:  Your physician has recommended you make the following change in your medication:   DECREASE aspirin 81 mg daily   Labwork: None  Testing/Procedures: Your physician has requested that you have an echocardiogram. Echocardiography is a painless test that uses sound waves to create images of your heart. It provides your doctor with information about the size and shape of your heart and how well your heart's chambers and valves are working. This procedure takes approximately one hour. There are no restrictions for this procedure.  Your physician has recommended that you wear a ZIO patch monitor. ZIO patch monitors are medical devices that record the heart's electrical activity. Doctors most often use these monitors to diagnose arrhythmias. Arrhythmias are problems with the speed or rhythm of the heartbeat. The monitor is a small, portable device. You can wear one while you do your normal daily activities. This is usually used to diagnose what is causing palpitations/syncope (passing out). Wear two ZIO patch monitor for 14 days back to back.   Follow-Up: Your physician recommends that you schedule a follow-up appointment in: 2 months.   If you need a refill on your cardiac medications before your next appointment, please call your pharmacy.   Thank you for choosing [facility]! Robyne Peers, RN [phone]   Echocardiogram An echocardiogram, or echocardiography, uses sound waves (ultrasound) to produce an image of your heart. The echocardiogram is simple, painless, obtained within a short period of time, and offers valuable information to your health care provider. The images from an echocardiogram can provide information such as:  Evidence of coronary artery disease (CAD).  Heart size.  Heart muscle function.  Heart valve function.  Aneurysm detection.  Evidence of a past heart attack.  Fluid buildup around the heart.  Heart muscle  thickening.  Assess heart valve function.  Tell a health care provider about:  Any allergies you have.  All medicines you are taking, including vitamins, herbs, eye drops, creams, and over-the-counter medicines.  Any problems you or family members have had with anesthetic medicines.  Any blood disorders you have.  Any surgeries you have had.  Any medical conditions you have.  Whether you are pregnant or may be pregnant. What happens before the procedure? No special preparation is needed. Eat and drink normally. What happens during the procedure?  In order to produce an image of your heart, gel will be applied to your chest and a wand-like tool (transducer) will be moved over your chest. The gel will help transmit the sound waves from the transducer. The sound waves will harmlessly bounce off your heart to allow the heart images to be captured in real-time motion. These images will then be recorded.  You may need an IV to receive a medicine that improves the quality of the pictures. What happens after the procedure? You may return to your normal schedule including diet, activities, and medicines, unless your health care provider tells you otherwise. This information is not intended to replace advice given to you by your health care provider. Make sure you discuss any questions you have with your health care provider. Document Released: 03/13/2000 Document Revised: 11/02/2015 Document Reviewed: 11/21/2012 Elsevier Interactive Patient Education  2017 [employer].

## 2017-12-14 DIAGNOSIS — R197 Diarrhea, unspecified: Secondary | ICD-10-CM | POA: Diagnosis not present

## 2017-12-15 DIAGNOSIS — D509 Iron deficiency anemia, unspecified: Secondary | ICD-10-CM | POA: Diagnosis not present

## 2017-12-15 DIAGNOSIS — I129 Hypertensive chronic kidney disease with stage 1 through stage 4 chronic kidney disease, or unspecified chronic kidney disease: Secondary | ICD-10-CM | POA: Diagnosis not present

## 2017-12-15 DIAGNOSIS — I4891 Unspecified atrial fibrillation: Secondary | ICD-10-CM | POA: Diagnosis not present

## 2017-12-15 DIAGNOSIS — N183 Chronic kidney disease, stage 3 (moderate): Secondary | ICD-10-CM | POA: Diagnosis not present

## 2017-12-15 DIAGNOSIS — K529 Noninfective gastroenteritis and colitis, unspecified: Secondary | ICD-10-CM | POA: Diagnosis not present

## 2017-12-15 DIAGNOSIS — I69351 Hemiplegia and hemiparesis following cerebral infarction affecting right dominant side: Secondary | ICD-10-CM | POA: Diagnosis not present

## 2017-12-17 DIAGNOSIS — K529 Noninfective gastroenteritis and colitis, unspecified: Secondary | ICD-10-CM | POA: Diagnosis not present

## 2017-12-17 DIAGNOSIS — I4891 Unspecified atrial fibrillation: Secondary | ICD-10-CM | POA: Diagnosis not present

## 2017-12-17 DIAGNOSIS — D509 Iron deficiency anemia, unspecified: Secondary | ICD-10-CM | POA: Diagnosis not present

## 2017-12-17 DIAGNOSIS — I129 Hypertensive chronic kidney disease with stage 1 through stage 4 chronic kidney disease, or unspecified chronic kidney disease: Secondary | ICD-10-CM | POA: Diagnosis not present

## 2017-12-17 DIAGNOSIS — N183 Chronic kidney disease, stage 3 (moderate): Secondary | ICD-10-CM | POA: Diagnosis not present

## 2017-12-17 DIAGNOSIS — I69351 Hemiplegia and hemiparesis following cerebral infarction affecting right dominant side: Secondary | ICD-10-CM | POA: Diagnosis not present

## 2017-12-20 DIAGNOSIS — D509 Iron deficiency anemia, unspecified: Secondary | ICD-10-CM | POA: Diagnosis not present

## 2017-12-20 DIAGNOSIS — I69351 Hemiplegia and hemiparesis following cerebral infarction affecting right dominant side: Secondary | ICD-10-CM | POA: Diagnosis not present

## 2017-12-20 DIAGNOSIS — N183 Chronic kidney disease, stage 3 (moderate): Secondary | ICD-10-CM | POA: Diagnosis not present

## 2017-12-20 DIAGNOSIS — R079 Chest pain, unspecified: Secondary | ICD-10-CM | POA: Diagnosis not present

## 2017-12-20 DIAGNOSIS — I4891 Unspecified atrial fibrillation: Secondary | ICD-10-CM | POA: Diagnosis not present

## 2017-12-20 DIAGNOSIS — R0789 Other chest pain: Secondary | ICD-10-CM | POA: Diagnosis not present

## 2017-12-20 DIAGNOSIS — R531 Weakness: Secondary | ICD-10-CM | POA: Diagnosis not present

## 2017-12-20 DIAGNOSIS — J984 Other disorders of lung: Secondary | ICD-10-CM | POA: Diagnosis not present

## 2017-12-20 DIAGNOSIS — K529 Noninfective gastroenteritis and colitis, unspecified: Secondary | ICD-10-CM | POA: Diagnosis not present

## 2017-12-20 DIAGNOSIS — I129 Hypertensive chronic kidney disease with stage 1 through stage 4 chronic kidney disease, or unspecified chronic kidney disease: Secondary | ICD-10-CM | POA: Diagnosis not present

## 2017-12-20 DIAGNOSIS — R29810 Facial weakness: Secondary | ICD-10-CM | POA: Diagnosis not present

## 2017-12-22 DIAGNOSIS — I129 Hypertensive chronic kidney disease with stage 1 through stage 4 chronic kidney disease, or unspecified chronic kidney disease: Secondary | ICD-10-CM | POA: Diagnosis not present

## 2017-12-22 DIAGNOSIS — K529 Noninfective gastroenteritis and colitis, unspecified: Secondary | ICD-10-CM | POA: Diagnosis not present

## 2017-12-22 DIAGNOSIS — D509 Iron deficiency anemia, unspecified: Secondary | ICD-10-CM | POA: Diagnosis not present

## 2017-12-22 DIAGNOSIS — N183 Chronic kidney disease, stage 3 (moderate): Secondary | ICD-10-CM | POA: Diagnosis not present

## 2017-12-22 DIAGNOSIS — I69351 Hemiplegia and hemiparesis following cerebral infarction affecting right dominant side: Secondary | ICD-10-CM | POA: Diagnosis not present

## 2017-12-22 DIAGNOSIS — I4891 Unspecified atrial fibrillation: Secondary | ICD-10-CM | POA: Diagnosis not present

## 2017-12-23 DIAGNOSIS — F419 Anxiety disorder, unspecified: Secondary | ICD-10-CM | POA: Diagnosis not present

## 2017-12-23 DIAGNOSIS — K219 Gastro-esophageal reflux disease without esophagitis: Secondary | ICD-10-CM | POA: Diagnosis not present

## 2017-12-23 DIAGNOSIS — D649 Anemia, unspecified: Secondary | ICD-10-CM | POA: Diagnosis not present

## 2017-12-23 DIAGNOSIS — N184 Chronic kidney disease, stage 4 (severe): Secondary | ICD-10-CM | POA: Diagnosis not present

## 2017-12-23 DIAGNOSIS — Z79899 Other long term (current) drug therapy: Secondary | ICD-10-CM | POA: Diagnosis not present

## 2017-12-23 DIAGNOSIS — R609 Edema, unspecified: Secondary | ICD-10-CM | POA: Diagnosis not present

## 2017-12-23 DIAGNOSIS — Z6823 Body mass index (BMI) 23.0-23.9, adult: Secondary | ICD-10-CM | POA: Diagnosis not present

## 2017-12-23 DIAGNOSIS — Z23 Encounter for immunization: Secondary | ICD-10-CM | POA: Diagnosis not present

## 2017-12-23 DIAGNOSIS — Z09 Encounter for follow-up examination after completed treatment for conditions other than malignant neoplasm: Secondary | ICD-10-CM | POA: Diagnosis not present

## 2017-12-23 DIAGNOSIS — I10 Essential (primary) hypertension: Secondary | ICD-10-CM | POA: Diagnosis not present

## 2017-12-24 ENCOUNTER — Encounter: Payer: Self-pay | Admitting: Neurology

## 2017-12-24 DIAGNOSIS — D509 Iron deficiency anemia, unspecified: Secondary | ICD-10-CM | POA: Diagnosis not present

## 2017-12-24 DIAGNOSIS — I69351 Hemiplegia and hemiparesis following cerebral infarction affecting right dominant side: Secondary | ICD-10-CM | POA: Diagnosis not present

## 2017-12-24 DIAGNOSIS — I129 Hypertensive chronic kidney disease with stage 1 through stage 4 chronic kidney disease, or unspecified chronic kidney disease: Secondary | ICD-10-CM | POA: Diagnosis not present

## 2017-12-24 DIAGNOSIS — N183 Chronic kidney disease, stage 3 (moderate): Secondary | ICD-10-CM | POA: Diagnosis not present

## 2017-12-24 DIAGNOSIS — I4891 Unspecified atrial fibrillation: Secondary | ICD-10-CM | POA: Diagnosis not present

## 2017-12-24 DIAGNOSIS — K529 Noninfective gastroenteritis and colitis, unspecified: Secondary | ICD-10-CM | POA: Diagnosis not present

## 2017-12-28 DIAGNOSIS — D509 Iron deficiency anemia, unspecified: Secondary | ICD-10-CM | POA: Diagnosis not present

## 2017-12-28 DIAGNOSIS — I129 Hypertensive chronic kidney disease with stage 1 through stage 4 chronic kidney disease, or unspecified chronic kidney disease: Secondary | ICD-10-CM | POA: Diagnosis not present

## 2017-12-28 DIAGNOSIS — N183 Chronic kidney disease, stage 3 (moderate): Secondary | ICD-10-CM | POA: Diagnosis not present

## 2017-12-28 DIAGNOSIS — I69351 Hemiplegia and hemiparesis following cerebral infarction affecting right dominant side: Secondary | ICD-10-CM | POA: Diagnosis not present

## 2017-12-28 DIAGNOSIS — K529 Noninfective gastroenteritis and colitis, unspecified: Secondary | ICD-10-CM | POA: Diagnosis not present

## 2017-12-28 DIAGNOSIS — I4891 Unspecified atrial fibrillation: Secondary | ICD-10-CM | POA: Diagnosis not present

## 2017-12-29 DIAGNOSIS — I4891 Unspecified atrial fibrillation: Secondary | ICD-10-CM | POA: Diagnosis not present

## 2017-12-29 DIAGNOSIS — I69351 Hemiplegia and hemiparesis following cerebral infarction affecting right dominant side: Secondary | ICD-10-CM | POA: Diagnosis not present

## 2017-12-29 DIAGNOSIS — I129 Hypertensive chronic kidney disease with stage 1 through stage 4 chronic kidney disease, or unspecified chronic kidney disease: Secondary | ICD-10-CM | POA: Diagnosis not present

## 2017-12-29 DIAGNOSIS — K529 Noninfective gastroenteritis and colitis, unspecified: Secondary | ICD-10-CM | POA: Diagnosis not present

## 2017-12-29 DIAGNOSIS — N183 Chronic kidney disease, stage 3 (moderate): Secondary | ICD-10-CM | POA: Diagnosis not present

## 2017-12-29 DIAGNOSIS — D509 Iron deficiency anemia, unspecified: Secondary | ICD-10-CM | POA: Diagnosis not present

## 2017-12-30 DIAGNOSIS — K529 Noninfective gastroenteritis and colitis, unspecified: Secondary | ICD-10-CM | POA: Diagnosis not present

## 2017-12-30 DIAGNOSIS — I4891 Unspecified atrial fibrillation: Secondary | ICD-10-CM | POA: Diagnosis not present

## 2017-12-30 DIAGNOSIS — I69351 Hemiplegia and hemiparesis following cerebral infarction affecting right dominant side: Secondary | ICD-10-CM | POA: Diagnosis not present

## 2017-12-30 DIAGNOSIS — I129 Hypertensive chronic kidney disease with stage 1 through stage 4 chronic kidney disease, or unspecified chronic kidney disease: Secondary | ICD-10-CM | POA: Diagnosis not present

## 2017-12-30 DIAGNOSIS — D509 Iron deficiency anemia, unspecified: Secondary | ICD-10-CM | POA: Diagnosis not present

## 2017-12-30 DIAGNOSIS — N183 Chronic kidney disease, stage 3 (moderate): Secondary | ICD-10-CM | POA: Diagnosis not present

## 2017-12-31 ENCOUNTER — Ambulatory Visit (INDEPENDENT_AMBULATORY_CARE_PROVIDER_SITE_OTHER): Payer: Medicare HMO

## 2017-12-31 DIAGNOSIS — I129 Hypertensive chronic kidney disease with stage 1 through stage 4 chronic kidney disease, or unspecified chronic kidney disease: Secondary | ICD-10-CM | POA: Diagnosis not present

## 2017-12-31 DIAGNOSIS — R001 Bradycardia, unspecified: Secondary | ICD-10-CM | POA: Diagnosis not present

## 2017-12-31 DIAGNOSIS — I69351 Hemiplegia and hemiparesis following cerebral infarction affecting right dominant side: Secondary | ICD-10-CM | POA: Diagnosis not present

## 2017-12-31 DIAGNOSIS — N183 Chronic kidney disease, stage 3 (moderate): Secondary | ICD-10-CM | POA: Diagnosis not present

## 2017-12-31 DIAGNOSIS — D509 Iron deficiency anemia, unspecified: Secondary | ICD-10-CM | POA: Diagnosis not present

## 2017-12-31 DIAGNOSIS — K529 Noninfective gastroenteritis and colitis, unspecified: Secondary | ICD-10-CM | POA: Diagnosis not present

## 2017-12-31 DIAGNOSIS — I4891 Unspecified atrial fibrillation: Secondary | ICD-10-CM | POA: Diagnosis not present

## 2017-12-31 NOTE — Progress Notes (Signed)
Complete echocardiogram with bubble study has been performed. IV was placed in the right [facility] area with 1 attempt. After the study the IV was removed intact, area was clean and dry.  Jimmy Carlyle Mcelrath RDCS, RVT

## 2018-01-03 DIAGNOSIS — K529 Noninfective gastroenteritis and colitis, unspecified: Secondary | ICD-10-CM | POA: Diagnosis not present

## 2018-01-03 DIAGNOSIS — I129 Hypertensive chronic kidney disease with stage 1 through stage 4 chronic kidney disease, or unspecified chronic kidney disease: Secondary | ICD-10-CM | POA: Diagnosis not present

## 2018-01-03 DIAGNOSIS — N183 Chronic kidney disease, stage 3 (moderate): Secondary | ICD-10-CM | POA: Diagnosis not present

## 2018-01-03 DIAGNOSIS — I69351 Hemiplegia and hemiparesis following cerebral infarction affecting right dominant side: Secondary | ICD-10-CM | POA: Diagnosis not present

## 2018-01-03 DIAGNOSIS — I4891 Unspecified atrial fibrillation: Secondary | ICD-10-CM | POA: Diagnosis not present

## 2018-01-03 DIAGNOSIS — D509 Iron deficiency anemia, unspecified: Secondary | ICD-10-CM | POA: Diagnosis not present

## 2018-01-05 DIAGNOSIS — N183 Chronic kidney disease, stage 3 (moderate): Secondary | ICD-10-CM | POA: Diagnosis not present

## 2018-01-05 DIAGNOSIS — I69351 Hemiplegia and hemiparesis following cerebral infarction affecting right dominant side: Secondary | ICD-10-CM | POA: Diagnosis not present

## 2018-01-05 DIAGNOSIS — K529 Noninfective gastroenteritis and colitis, unspecified: Secondary | ICD-10-CM | POA: Diagnosis not present

## 2018-01-05 DIAGNOSIS — I129 Hypertensive chronic kidney disease with stage 1 through stage 4 chronic kidney disease, or unspecified chronic kidney disease: Secondary | ICD-10-CM | POA: Diagnosis not present

## 2018-01-05 DIAGNOSIS — I4891 Unspecified atrial fibrillation: Secondary | ICD-10-CM | POA: Diagnosis not present

## 2018-01-05 DIAGNOSIS — D509 Iron deficiency anemia, unspecified: Secondary | ICD-10-CM | POA: Diagnosis not present

## 2018-01-06 ENCOUNTER — Telehealth: Payer: Self-pay | Admitting: Cardiology

## 2018-01-06 ENCOUNTER — Other Ambulatory Visit: Payer: Self-pay

## 2018-01-06 DIAGNOSIS — D509 Iron deficiency anemia, unspecified: Secondary | ICD-10-CM | POA: Diagnosis not present

## 2018-01-06 DIAGNOSIS — I69351 Hemiplegia and hemiparesis following cerebral infarction affecting right dominant side: Secondary | ICD-10-CM | POA: Diagnosis not present

## 2018-01-06 DIAGNOSIS — I4891 Unspecified atrial fibrillation: Secondary | ICD-10-CM | POA: Diagnosis not present

## 2018-01-06 DIAGNOSIS — I129 Hypertensive chronic kidney disease with stage 1 through stage 4 chronic kidney disease, or unspecified chronic kidney disease: Secondary | ICD-10-CM | POA: Diagnosis not present

## 2018-01-06 DIAGNOSIS — N183 Chronic kidney disease, stage 3 (moderate): Secondary | ICD-10-CM | POA: Diagnosis not present

## 2018-01-06 DIAGNOSIS — K529 Noninfective gastroenteritis and colitis, unspecified: Secondary | ICD-10-CM | POA: Diagnosis not present

## 2018-01-06 MED ORDER — ATORVASTATIN CALCIUM 40 MG PO TABS: 40.0000 mg | ORAL_TABLET | Freq: Every day | ORAL | 2 refills | Status: DC

## 2018-01-06 NOTE — Telephone Encounter (Signed)
° ° ° °  1. Which medications need to be refilled? (please list name of each medication and dose if known) atorvastatin 40mg   2. Which pharmacy/location (including street and city if local pharmacy) is medication to be sent to? [facility] on [address] [place]  3. Do they need a 30 day or 90 day supply? [place]

## 2018-01-06 NOTE — Telephone Encounter (Signed)
Refill was sent to appropriate pharmacy task complete

## 2018-01-11 ENCOUNTER — Encounter: Payer: Self-pay | Admitting: *Deleted

## 2018-01-11 ENCOUNTER — Telehealth: Payer: Self-pay | Admitting: *Deleted

## 2018-01-11 DIAGNOSIS — Z01810 Encounter for preprocedural cardiovascular examination: Secondary | ICD-10-CM

## 2018-01-11 DIAGNOSIS — I69351 Hemiplegia and hemiparesis following cerebral infarction affecting right dominant side: Secondary | ICD-10-CM | POA: Diagnosis not present

## 2018-01-11 DIAGNOSIS — I10 Essential (primary) hypertension: Secondary | ICD-10-CM

## 2018-01-11 DIAGNOSIS — I4891 Unspecified atrial fibrillation: Secondary | ICD-10-CM | POA: Diagnosis not present

## 2018-01-11 DIAGNOSIS — I129 Hypertensive chronic kidney disease with stage 1 through stage 4 chronic kidney disease, or unspecified chronic kidney disease: Secondary | ICD-10-CM | POA: Diagnosis not present

## 2018-01-11 DIAGNOSIS — K529 Noninfective gastroenteritis and colitis, unspecified: Secondary | ICD-10-CM | POA: Diagnosis not present

## 2018-01-11 DIAGNOSIS — D509 Iron deficiency anemia, unspecified: Secondary | ICD-10-CM | POA: Diagnosis not present

## 2018-01-11 DIAGNOSIS — N183 Chronic kidney disease, stage 3 (moderate): Secondary | ICD-10-CM | POA: Diagnosis not present

## 2018-01-11 NOTE — Telephone Encounter (Signed)
Patient's daughter, Ivin Booty, informed per DPR that TEE has been scheduled for Thursday, 01/13/18 with Dr. Debara Pickett at 2:00 pm. Instruction letter reviewed with her and advised that a copy has been sent to the [facility] office for patient to pick up when she comes for lab work tomorrow, 01/12/18. Ivin Booty verbalized understanding. No further questions.

## 2018-01-11 NOTE — Addendum Note (Signed)
Addended by: Shirlee More on: 01/11/2018 10:03 AM   Modules accepted: Orders, SmartSet

## 2018-01-11 NOTE — Addendum Note (Signed)
Addended by: Austin Miles on: 01/11/2018 02:37 PM   Modules accepted: Orders

## 2018-01-12 DIAGNOSIS — I10 Essential (primary) hypertension: Secondary | ICD-10-CM | POA: Diagnosis not present

## 2018-01-12 DIAGNOSIS — Z01810 Encounter for preprocedural cardiovascular examination: Secondary | ICD-10-CM | POA: Diagnosis not present

## 2018-01-12 LAB — BASIC METABOLIC PANEL
BUN / CREAT RATIO: 16 (ref 12–28)
BUN: 23 mg/dL (ref 8–27)
CALCIUM: 9.5 mg/dL (ref 8.7–10.3)
CHLORIDE: 105 mmol/L (ref 96–106)
CO2: 21 mmol/L (ref 20–29)
CREATININE: 1.45 mg/dL — AB (ref 0.57–1.00)
GFR calc Af Amer: 38 mL/min/{1.73_m2} — ABNORMAL LOW (ref >59)
GFR calc non Af Amer: 33 mL/min/{1.73_m2} — ABNORMAL LOW (ref >59)
Glucose: 100 mg/dL — ABNORMAL HIGH (ref 65–99)
Potassium: 4.2 mmol/L (ref 3.5–5.2)
Sodium: 140 mmol/L (ref 134–144)

## 2018-01-12 LAB — CBC
HEMOGLOBIN: 11.8 g/dL (ref 11.1–15.9)
Hematocrit: 34.5 % (ref 34.0–46.6)
MCH: 30.7 pg (ref 26.6–33.0)
MCHC: 34.2 g/dL (ref 31.5–35.7)
MCV: 90 fL (ref 79–97)
PLATELETS: 277 10*3/uL (ref 150–450)
RBC: 3.84 x10E6/uL (ref 3.77–5.28)
RDW: 13.2 % (ref 12.3–15.4)
WBC: 6.8 10*3/uL (ref 3.4–10.8)

## 2018-01-13 ENCOUNTER — Ambulatory Visit (HOSPITAL_BASED_OUTPATIENT_CLINIC_OR_DEPARTMENT_OTHER): Payer: Medicare HMO

## 2018-01-13 ENCOUNTER — Ambulatory Visit (HOSPITAL_COMMUNITY)
Admission: RE | Admit: 2018-01-13 | Discharge: 2018-01-13 | Disposition: A | Discharge status: Home / Self Care | Payer: Medicare HMO | Source: Ambulatory Visit | Attending: Internal Medicine | Admitting: Internal Medicine

## 2018-01-13 ENCOUNTER — Encounter (HOSPITAL_COMMUNITY): Payer: Self-pay | Admitting: Internal Medicine

## 2018-01-13 ENCOUNTER — Other Ambulatory Visit: Payer: Self-pay

## 2018-01-13 ENCOUNTER — Encounter (HOSPITAL_COMMUNITY)
Admission: RE | Disposition: A | Discharge status: Home / Self Care | Payer: Self-pay | Source: Ambulatory Visit | Attending: Internal Medicine

## 2018-01-13 DIAGNOSIS — Z9842 Cataract extraction status, left eye: Secondary | ICD-10-CM | POA: Insufficient documentation

## 2018-01-13 DIAGNOSIS — F419 Anxiety disorder, unspecified: Secondary | ICD-10-CM | POA: Insufficient documentation

## 2018-01-13 DIAGNOSIS — I351 Nonrheumatic aortic (valve) insufficiency: Secondary | ICD-10-CM

## 2018-01-13 DIAGNOSIS — I061 Rheumatic aortic insufficiency: Secondary | ICD-10-CM | POA: Insufficient documentation

## 2018-01-13 DIAGNOSIS — K219 Gastro-esophageal reflux disease without esophagitis: Secondary | ICD-10-CM | POA: Insufficient documentation

## 2018-01-13 DIAGNOSIS — Z9841 Cataract extraction status, right eye: Secondary | ICD-10-CM | POA: Diagnosis not present

## 2018-01-13 DIAGNOSIS — Q210 Ventricular septal defect: Secondary | ICD-10-CM | POA: Diagnosis not present

## 2018-01-13 DIAGNOSIS — Z9889 Other specified postprocedural states: Secondary | ICD-10-CM | POA: Insufficient documentation

## 2018-01-13 DIAGNOSIS — Z8249 Family history of ischemic heart disease and other diseases of the circulatory system: Secondary | ICD-10-CM | POA: Diagnosis not present

## 2018-01-13 DIAGNOSIS — Z79899 Other long term (current) drug therapy: Secondary | ICD-10-CM | POA: Diagnosis not present

## 2018-01-13 DIAGNOSIS — G629 Polyneuropathy, unspecified: Secondary | ICD-10-CM | POA: Insufficient documentation

## 2018-01-13 DIAGNOSIS — Z7982 Long term (current) use of aspirin: Secondary | ICD-10-CM | POA: Diagnosis not present

## 2018-01-13 DIAGNOSIS — I714 Abdominal aortic aneurysm, without rupture: Secondary | ICD-10-CM | POA: Diagnosis not present

## 2018-01-13 DIAGNOSIS — M810 Age-related osteoporosis without current pathological fracture: Secondary | ICD-10-CM | POA: Insufficient documentation

## 2018-01-13 DIAGNOSIS — I420 Dilated cardiomyopathy: Secondary | ICD-10-CM | POA: Diagnosis not present

## 2018-01-13 DIAGNOSIS — Z9049 Acquired absence of other specified parts of digestive tract: Secondary | ICD-10-CM | POA: Insufficient documentation

## 2018-01-13 DIAGNOSIS — Z90710 Acquired absence of both cervix and uterus: Secondary | ICD-10-CM | POA: Insufficient documentation

## 2018-01-13 DIAGNOSIS — Z885 Allergy status to narcotic agent status: Secondary | ICD-10-CM | POA: Diagnosis not present

## 2018-01-13 DIAGNOSIS — G459 Transient cerebral ischemic attack, unspecified: Secondary | ICD-10-CM | POA: Diagnosis not present

## 2018-01-13 DIAGNOSIS — Z888 Allergy status to other drugs, medicaments and biological substances status: Secondary | ICD-10-CM | POA: Diagnosis not present

## 2018-01-13 DIAGNOSIS — Z8774 Personal history of (corrected) congenital malformations of heart and circulatory system: Secondary | ICD-10-CM | POA: Insufficient documentation

## 2018-01-13 DIAGNOSIS — I10 Essential (primary) hypertension: Secondary | ICD-10-CM | POA: Insufficient documentation

## 2018-01-13 DIAGNOSIS — G4700 Insomnia, unspecified: Secondary | ICD-10-CM | POA: Diagnosis not present

## 2018-01-13 DIAGNOSIS — I639 Cerebral infarction, unspecified: Secondary | ICD-10-CM | POA: Diagnosis present

## 2018-01-13 HISTORY — PX: TEE WITHOUT CARDIOVERSION: SHX5443

## 2018-01-13 SURGERY — ECHOCARDIOGRAM, TRANSESOPHAGEAL
Anesthesia: Moderate Sedation

## 2018-01-13 MED ORDER — LIDOCAINE VISCOUS HCL 2 % MT SOLN
OROMUCOSAL | Status: AC
  Filled 2018-01-13: qty 15

## 2018-01-13 MED ORDER — MIDAZOLAM HCL 5 MG/ML IJ SOLN
INTRAMUSCULAR | Status: AC
  Filled 2018-01-13: qty 2

## 2018-01-13 MED ORDER — FENTANYL CITRATE (PF) 100 MCG/2ML IJ SOLN
INTRAMUSCULAR | Status: AC
  Filled 2018-01-13: qty 2

## 2018-01-13 MED ORDER — MIDAZOLAM HCL 10 MG/2ML IJ SOLN
INTRAMUSCULAR | Status: DC | PRN
  Administered 2018-01-13: 1 mg via INTRAVENOUS
  Administered 2018-01-13: 2 mg via INTRAVENOUS
  Administered 2018-01-13: 1 mg via INTRAVENOUS

## 2018-01-13 MED ORDER — BUTAMBEN-TETRACAINE-BENZOCAINE 2-2-14 % EX AERO
INHALATION_SPRAY | CUTANEOUS | Status: DC | PRN
  Administered 2018-01-13: 2 via TOPICAL

## 2018-01-13 MED ORDER — FENTANYL CITRATE (PF) 100 MCG/2ML IJ SOLN
INTRAMUSCULAR | Status: DC | PRN
  Administered 2018-01-13 (×2): 12.5 ug via INTRAVENOUS

## 2018-01-13 MED ORDER — LIDOCAINE VISCOUS HCL 2 % MT SOLN
OROMUCOSAL | Status: DC | PRN
  Administered 2018-01-13: 1 via OROMUCOSAL

## 2018-01-13 MED ORDER — SODIUM CHLORIDE 0.9 % IV SOLN
INTRAVENOUS | Status: DC
  Administered 2018-01-13: 13:00:00 via INTRAVENOUS

## 2018-01-13 NOTE — Progress Notes (Signed)
  Echocardiogram Echocardiogram Transesophageal has been performed.  Jennifer Buchanan 01/13/2018, 2:31 PM

## 2018-01-13 NOTE — H&P (Addendum)
ADMISSION HISTORY & PHYSICAL  Patient Name: Jennifer Buchanan Date of Encounter: 01/13/2018 Primary Care Physician: Nicholos Johns, MD Cardiologist: No primary care provider on file.  Chief Complaint   History of TIA  Patient Profile   84 yo female with history of TIA and prior PFO closure. Surface echo showed possible VSD - Dr. Bettina Gavia requests TEE to further evaluate.  HPI   This is a 84 y.o. female with a past medical history significant for dominant hemispheric TIA. She is s/p loop recorder. There is history of PFO with surgical closure in the past. A possible VSD was noted during TTE. She is referred to evaluate PFO closure and for VSD. Bubble study recently was negative for shunt.    PMHx   Past Medical History:  Diagnosis Date  . AAA (abdominal aortic aneurysm) ([place]) 08/05/2017  . Allergic rhinitis 08/05/2017  . Anxiety 08/05/2017  . B12 deficiency 08/05/2017  . Cerebrovascular accident (CVA) due to embolism ([place]) 06/27/2015  . Chest pain 08/05/2017  . GERD (gastroesophageal reflux disease) 08/05/2017  . H/O heart surgery    aneurysm and hole in heart  . HTN (hypertension) 06/26/2015  . Hyperkalemia 08/05/2017  . Hypertension   . Insomnia 08/05/2017  . Neuropathy 06/26/2015  . Osteoporosis 08/05/2017  . Pre-operative cardiovascular examination 06/27/2015  . Unsteady gait 06/26/2015  . VSD (ventricular septal defect) 08/05/2017    Past Surgical History:  Procedure Laterality Date  . ABDOMINAL HYSTERECTOMY    . CARDIAC SURGERY     s/p AAA  . CATARACT EXTRACTION Bilateral   . CHOLECYSTECTOMY      FAMHx   Family History  Problem Relation Age of Onset  . Hypertension Mother   . Heart attack Mother   . Heart attack Sister   . Heart attack Brother     SOCHx    reports that she has never smoked. She has never used smokeless tobacco. She reports that she does not drink alcohol or use drugs.  Outpatient Medications   No current facility-administered medications on file prior  to encounter.    Current Outpatient Medications on File Prior to Encounter  Medication Sig Dispense Refill  . acetaminophen (ACETAMINOPHEN EXTRA STRENGTH) 500 MG tablet Take 1,000 mg by mouth every 6 (six) hours as needed (pain).     Marland Kitchen amLODipine (NORVASC) 10 MG tablet Take 10 mg by mouth daily.    Marland Kitchen aspirin EC 81 MG tablet Take 1 tablet (81 mg total) by mouth daily. 90 tablet 3  . atorvastatin (LIPITOR) 40 MG tablet Take 1 tablet (40 mg total) by mouth daily. 90 tablet 2  . calcium carbonate (TUMS) 500 MG chewable tablet Chew 2 tablets by mouth every 6 (six) hours as needed for indigestion or heartburn.    . Inulin (FIBER CHOICE) 1.5 g CHEW Chew 2 tablets by mouth 2 (two) times daily.    . isosorbide mononitrate (IMDUR) 60 MG 24 hr tablet Take 60 mg by mouth daily.  0  . loperamide (IMODIUM A-D) 2 MG tablet Take 2 mg by mouth 4 (four) times daily as needed for diarrhea or loose stools.    Marland Kitchen omeprazole (PRILOSEC) 20 MG capsule Take 20 mg by mouth 2 (two) times daily before a meal.     . trolamine salicylate (ASPERCREME) 10 % cream Apply 1 application topically 2 (two) times daily as needed for muscle pain.    . nitroGLYCERIN (NITROSTAT) 0.4 MG SL tablet Place 0.4 mg under the tongue every 5 (  five) minutes as needed for chest pain.   0  . sertraline (ZOLOFT) 50 MG tablet Take 50 mg by mouth at bedtime.      Inpatient Medications    Scheduled Meds:   Continuous Infusions: . sodium chloride 20 mL/hr at 01/13/18 1321    PRN Meds:    ALLERGIES   Allergies  Allergen Reactions  . Metoclopramide Nausea And Vomiting    Reglan  . Demerol [Meperidine] Nausea And Vomiting    ROS   Pertinent items noted in HPI and remainder of comprehensive ROS otherwise negative.  Vitals   Vitals:   01/13/18 1259  BP: (!) 162/70  Pulse: 80  Resp: (!) 23  Temp: 97.9 F (36.6 C)  TempSrc: Oral  SpO2: 97%  Weight: 56.2 kg  Height: 5' (1.524 m)   No intake or output data in the 24 hours  ending 01/13/18 1332 Filed Weights   01/13/18 1259  Weight: 56.2 kg    Physical Exam   General appearance: alert and no distress Lungs: clear to auscultation bilaterally Heart: regular rate and rhythm, S1, S2 normal, no murmur, click, rub or gallop Extremities: extremities normal, atraumatic, no cyanosis or edema Neurologic: Grossly normal  Labs   Results for orders placed or performed in visit on 01/11/18 (from the past 48 hour(s))  CBC     Status: None   Collection Time: 01/12/18 10:51 AM  Result Value Ref Range   WBC 6.8 3.4 - 10.8 x10E3/uL   RBC 3.84 3.77 - 5.28 x10E6/uL   Hemoglobin 11.8 11.1 - 15.9 g/dL   Hematocrit 34.5 34.0 - 46.6 %   MCV 90 79 - 97 fL   MCH 30.7 26.6 - 33.0 pg   MCHC 34.2 31.5 - 35.7 g/dL   RDW 13.2 12.3 - 15.4 %   Platelets 277 150 - 450 B71I9/CV  Basic Metabolic Panel (BMET)     Status: Abnormal   Collection Time: 01/12/18 10:51 AM  Result Value Ref Range   Glucose 100 (H) 65 - 99 mg/dL   BUN 23 8 - 27 mg/dL   Creatinine, Ser 1.45 (H) 0.57 - 1.00 mg/dL   GFR calc non Af Amer 33 (L) >59 mL/min/1.73   GFR calc Af Amer 38 (L) >59 mL/min/1.73   BUN/Creatinine Ratio 16 12 - 28   Sodium 140 134 - 144 mmol/L   Potassium 4.2 3.5 - 5.2 mmol/L   Chloride 105 96 - 106 mmol/L   CO2 21 20 - 29 mmol/L   Calcium 9.5 8.7 - 10.3 mg/dL    ECG   N/A  Telemetry   Sinus - Personally Reviewed  Radiology   No results found.  Cardiac Studies   N/A  Assessment   Principal Problem:   Cerebrovascular accident (CVA) due to embolism ([place])   Plan   1. Plan TEE today with history of TIA/stroke and prior PFO closure. Imaging demonstrates possible VSD. She has no questions regarding the procedure.  Time Spent Directly with Patient:  I have spent a total of 15 minutes with patient reviewing hospital notes, telemetry, EKGs, labs and examining the patient as well as establishing an assessment and plan that was discussed with the patient.  > 50% of time  was spent in direct patient care.   Length of Stay:  LOS: 0 days   Pixie Casino, MD, [facility], [place] Director of the Advanced Lipid Disorders &  Cardiovascular Risk Reduction Clinic Diplomate of  the [employer] of Clinical Lipidology Attending Cardiologist  Direct Dial: [phone]  Fax: [phone]  Website:  [email or web address]   Nadean Corwin Neira Bentsen 01/13/2018, 1:32 PM

## 2018-01-13 NOTE — Discharge Instructions (Signed)

## 2018-01-13 NOTE — CV Procedure (Signed)
TRANSESOPHAGEAL ECHOCARDIOGRAM (TEE) NOTE  INDICATIONS: TIA/Stroke  PROCEDURE:   Informed consent was obtained prior to the procedure. The risks, benefits and alternatives for the procedure were discussed and the patient comprehended these risks.  Risks include, but are not limited to, cough, sore throat, vomiting, nausea, somnolence, esophageal and stomach trauma or perforation, bleeding, low blood pressure, aspiration, pneumonia, infection, trauma to the teeth and death.    After a procedural time-out, the patient was given 4 mg versed and 37.5 mcg fentanyl for moderate sedation.  The patient's heart rate, blood pressure, and oxygen saturation are monitored continuously during the procedure.The oropharynx was anesthetized 10 cc of topical 1% viscous lidocaine and 1 cetacaine spray.  The transesophageal probe was inserted in the esophagus and stomach without difficulty and multiple views were obtained.  The patient was kept under observation until the patient left the procedure room.  The period of conscious sedation is 18 minutes, of which I was present face-to-face 100% of this time. The patient left the procedure room in stable condition.   Agitated microbubble saline contrast was administered.  COMPLICATIONS:    There were no immediate complications.  Findings:  1. LEFT VENTRICLE: The left ventricular wall thickness is mildly increased.  The left ventricular cavity is normal in size. Wall motion is normal.  LVEF is 55-60%.  2. RIGHT VENTRICLE:  The right ventricle is normal in structure and function without any thrombus or masses.    3. LEFT ATRIUM:  The left atrium is mildly dilated in size without any thrombus or masses.  There is not spontaneous echo contrast ("smoke") in the left atrium consistent with a low flow state.  4. LEFT ATRIAL APPENDAGE:  The left atrial appendage is free of any thrombus or masses. The appendage has single lobes. Pulse doppler indicates moderate flow in  the appendage.  5. ATRIAL SEPTUM:  The atrial septum has been previously repaired and appears intact and is free of thrombus and/or masses. Acoustic shadowing is noted. There is no evidence for interatrial shunting by color doppler and saline microbubble.  6. RIGHT ATRIUM:  The right atrium is normal in size and function without any thrombus or masses.  7. MITRAL VALVE:  The mitral valve is normal in structure and function with trivial regurgitation.  There were no vegetations or stenosis.  8. AORTIC VALVE:  The aortic valve is trileaflet and mildly sclerotic with trace to mild regurgitation.  There were no vegetations or stenosis  9. TRICUSPID VALVE:  The tricuspid valve is normal in structure and function with trivial regurgitation.  There were no vegetations or stenosis  10.  PULMONIC VALVE:  The pulmonic valve is normal in structure and function with no regurgitation.  There were no vegetations or stenosis.   11. AORTIC ARCH, ASCENDING AND DESCENDING AORTA:  There was grade 1 Ron Parker et. Al, 1992) atherosclerosis of the ascending aorta, aortic arch, or proximal descending aorta.  12. PULMONARY VEINS: Anomalous pulmonary venous return was not noted.  13. PERICARDIUM: The pericardium appeared normal and non-thickened.  There is no pericardial effusion.  IMPRESSION:   1. No VSD 2. S/p PFO closure without atrial level shunt 3. No LAA thrombus 4. Trileaflet, sclerotic aortic valve with trace to mild regurgitation 5. Mild LAE 6. LVEF 55-60%  RECOMMENDATIONS:    1.  No cause of cryptogenic stroke/TIA was identified. Patent PFO closure without atrial level shunting. No VSD.  Time Spent Directly with the Patient:  45 minutes   Chrissie Noa C.  Debara Pickett, MD, [facility], [place] Director of the Advanced Lipid Disorders &  Cardiovascular Risk Reduction Clinic Diplomate of the American Board of Clinical Lipidology Attending Cardiologist  Direct Dial: [phone]   Fax: [phone]  Website:  [email or web address] 01/13/2018, 2:09 PM

## 2018-01-17 DIAGNOSIS — I4891 Unspecified atrial fibrillation: Secondary | ICD-10-CM | POA: Diagnosis not present

## 2018-01-17 DIAGNOSIS — N183 Chronic kidney disease, stage 3 (moderate): Secondary | ICD-10-CM | POA: Diagnosis not present

## 2018-01-17 DIAGNOSIS — I129 Hypertensive chronic kidney disease with stage 1 through stage 4 chronic kidney disease, or unspecified chronic kidney disease: Secondary | ICD-10-CM | POA: Diagnosis not present

## 2018-01-17 DIAGNOSIS — K529 Noninfective gastroenteritis and colitis, unspecified: Secondary | ICD-10-CM | POA: Diagnosis not present

## 2018-01-17 DIAGNOSIS — D509 Iron deficiency anemia, unspecified: Secondary | ICD-10-CM | POA: Diagnosis not present

## 2018-01-17 DIAGNOSIS — I69351 Hemiplegia and hemiparesis following cerebral infarction affecting right dominant side: Secondary | ICD-10-CM | POA: Diagnosis not present

## 2018-01-19 DIAGNOSIS — R001 Bradycardia, unspecified: Secondary | ICD-10-CM | POA: Diagnosis not present

## 2018-01-20 DIAGNOSIS — I4891 Unspecified atrial fibrillation: Secondary | ICD-10-CM | POA: Diagnosis not present

## 2018-01-20 DIAGNOSIS — I69351 Hemiplegia and hemiparesis following cerebral infarction affecting right dominant side: Secondary | ICD-10-CM | POA: Diagnosis not present

## 2018-01-20 DIAGNOSIS — K529 Noninfective gastroenteritis and colitis, unspecified: Secondary | ICD-10-CM | POA: Diagnosis not present

## 2018-01-20 DIAGNOSIS — N183 Chronic kidney disease, stage 3 (moderate): Secondary | ICD-10-CM | POA: Diagnosis not present

## 2018-01-20 DIAGNOSIS — I129 Hypertensive chronic kidney disease with stage 1 through stage 4 chronic kidney disease, or unspecified chronic kidney disease: Secondary | ICD-10-CM | POA: Diagnosis not present

## 2018-01-20 DIAGNOSIS — D509 Iron deficiency anemia, unspecified: Secondary | ICD-10-CM | POA: Diagnosis not present

## 2018-01-21 ENCOUNTER — Ambulatory Visit (INDEPENDENT_AMBULATORY_CARE_PROVIDER_SITE_OTHER): Payer: Medicare HMO

## 2018-01-21 DIAGNOSIS — R001 Bradycardia, unspecified: Secondary | ICD-10-CM

## 2018-01-28 NOTE — Progress Notes (Signed)
NEUROLOGY CONSULTATION NOTE  SALWA BAI MRN: [id number] DOB: 1933/01/08  Referring provider: Nicholos Johns, MD Primary care provider: Nicholos Johns, MD  Reason for consult:  TIA  HISTORY OF PRESENT ILLNESS: Jennifer Buchanan is an 84 year old left-handed female with hypertension and history of stroke in 1995 status post PFO closure who presents for recent transient ischemic attack.  History supplemented by hospital and cardiology notes.  She was admitted to [facility] on 11/22/17 for TIA presenting with right-sided facial and arm and leg weakness with slurred speech lasting 24 hours.    MRI of brain demonstrated no acute stroke.  Echocardiogram demonstrated a possible small ventricular septal defect but otherwise unremarkable.  She did not exhibit any atrial fibrillation on telemetry during her admission.  She was kept on her ASA 81mg  daily.  She had a repeat episode less than a month later, lasted 3-4 hours.  Since the second TIA, she reports some blurred vision in the right eye.  She followed up with outpatient cardiology.  TTE with bubble study on 12/31/17 revealed no shunt.  She had a TEE on 01/13/18, which confirmed no VSD or other cardiac source of emboli.  She has an implantable loop recorder which has not yet identified atrial fibrillation.    She has prior history of several TIAs.  PAST MEDICAL HISTORY: Past Medical History:  Diagnosis Date  . AAA (abdominal aortic aneurysm) ([place]) 08/05/2017  . Allergic rhinitis 08/05/2017  . Anxiety 08/05/2017  . B12 deficiency 08/05/2017  . Cerebrovascular accident (CVA) due to embolism ([place]) 06/27/2015  . Chest pain 08/05/2017  . GERD (gastroesophageal reflux disease) 08/05/2017  . H/O heart surgery    aneurysm and hole in heart  . HTN (hypertension) 06/26/2015  . Hyperkalemia 08/05/2017  . Hypertension   . Insomnia 08/05/2017  . Neuropathy 06/26/2015  . Osteoporosis 08/05/2017  . Pre-operative cardiovascular examination 06/27/2015  . Unsteady gait  06/26/2015  . VSD (ventricular septal defect) 08/05/2017    PAST SURGICAL HISTORY: Past Surgical History:  Procedure Laterality Date  . ABDOMINAL HYSTERECTOMY    . CARDIAC SURGERY     s/p AAA  . CATARACT EXTRACTION Bilateral   . CHOLECYSTECTOMY    . TEE WITHOUT CARDIOVERSION N/A 01/13/2018   Procedure: TRANSESOPHAGEAL ECHOCARDIOGRAM (TEE);  Surgeon: Pixie Casino, MD;  Location: [facility] ENDOSCOPY;  Service: Cardiovascular;  Laterality: N/A;    MEDICATIONS: Current Outpatient Medications on File Prior to Visit  Medication Sig Dispense Refill  . acetaminophen (ACETAMINOPHEN EXTRA STRENGTH) 500 MG tablet Take 1,000 mg by mouth every 6 (six) hours as needed (pain).     Marland Kitchen amLODipine (NORVASC) 10 MG tablet Take 10 mg by mouth daily.    Marland Kitchen aspirin EC 81 MG tablet Take 1 tablet (81 mg total) by mouth daily. 90 tablet 3  . atorvastatin (LIPITOR) 40 MG tablet Take 1 tablet (40 mg total) by mouth daily. 90 tablet 2  . calcium carbonate (TUMS) 500 MG chewable tablet Chew 2 tablets by mouth every 6 (six) hours as needed for indigestion or heartburn.    . Inulin (FIBER CHOICE) 1.5 g CHEW Chew 2 tablets by mouth 2 (two) times daily.    . isosorbide mononitrate (IMDUR) 60 MG 24 hr tablet Take 60 mg by mouth daily.  0  . loperamide (IMODIUM A-D) 2 MG tablet Take 2 mg by mouth 4 (four) times daily as needed for diarrhea or loose stools.    . nitroGLYCERIN (NITROSTAT) 0.4 MG SL tablet Place 0.4  mg under the tongue every 5 (five) minutes as needed for chest pain.   0  . omeprazole (PRILOSEC) 20 MG capsule Take 20 mg by mouth 2 (two) times daily before a meal.     . sertraline (ZOLOFT) 50 MG tablet Take 50 mg by mouth at bedtime.    . trolamine salicylate (ASPERCREME) 10 % cream Apply 1 application topically 2 (two) times daily as needed for muscle pain.     No current facility-administered medications on file prior to visit.     ALLERGIES: Allergies  Allergen Reactions  . Metoclopramide Nausea And  Vomiting    Reglan  . Demerol [Meperidine] Nausea And Vomiting    FAMILY HISTORY: Family History  Problem Relation Age of Onset  . Hypertension Mother   . Heart attack Mother   . Heart attack Sister   . Heart attack Brother    SOCIAL HISTORY: Social History   Socioeconomic History  . Marital status: Married    Spouse name: Not on file  . Number of children: Not on file  . Years of education: Not on file  . Highest education level: Not on file  Occupational History  . Not on file  Social Needs  . Financial resource strain: Not on file  . Food insecurity:    Worry: Not on file    Inability: Not on file  . Transportation needs:    Medical: Not on file    Non-medical: Not on file  Tobacco Use  . Smoking status: Never Smoker  . Smokeless tobacco: Never Used  Substance and Sexual Activity  . Alcohol use: No  . Drug use: No  . Sexual activity: Not on file  Lifestyle  . Physical activity:    Days per week: Not on file    Minutes per session: Not on file  . Stress: Not on file  Relationships  . Social connections:    Talks on phone: Not on file    Gets together: Not on file    Attends religious service: Not on file    Active member of club or organization: Not on file    Attends meetings of clubs or organizations: Not on file    Relationship status: Not on file  . Intimate partner violence:    Fear of current or ex partner: Not on file    Emotionally abused: Not on file    Physically abused: Not on file    Forced sexual activity: Not on file  Other Topics Concern  . Not on file  Social History Narrative  . Not on file    REVIEW OF SYSTEMS: Constitutional: No fevers, chills, or sweats, no generalized fatigue, change in appetite Eyes: No visual changes, double vision, eye pain Ear, nose and throat: No hearing loss, ear pain, nasal congestion, sore throat Cardiovascular: No chest pain, palpitations Respiratory:  No shortness of breath at rest or with exertion,  wheezes GastrointestinaI: No nausea, vomiting, diarrhea, abdominal pain, fecal incontinence Genitourinary:  No dysuria, urinary retention or frequency Musculoskeletal:  No neck pain, back pain Integumentary: No rash, pruritus, skin lesions Neurological: as above Psychiatric: No depression, insomnia, anxiety Endocrine: No palpitations, fatigue, diaphoresis, mood swings, change in appetite, change in weight, increased thirst Hematologic/Lymphatic:  No purpura, petechiae. Allergic/Immunologic: no itchy/runny eyes, nasal congestion, recent allergic reactions, rashes  PHYSICAL EXAM: Blood pressure (!) 156/62, pulse 96, height 5' (1.524 m), weight 122 lb (55.3 kg), SpO2 98 %. General: No acute distress.  Patient appears well-groomed.  Head:  Normocephalic/atraumatic Eyes:  fundi examined but not visualized Neck: supple, no paraspinal tenderness, full range of motion Back: No paraspinal tenderness Heart: regular rate and rhythm Lungs: Clear to auscultation bilaterally. Vascular: No carotid bruits. Neurological Exam: Mental status: alert and oriented to person, place, and time, recent and remote memory intact, fund of knowledge intact, attention and concentration intact, speech fluent and not dysarthric, language intact. Cranial nerves: CN I: not tested CN II: pupils equal, round and reactive to light, visual fields intact CN III, IV, VI:  full range of motion, no nystagmus, no ptosis CN V: facial sensation intact CN VII: upper and lower face symmetric CN VIII: hearing intact CN IX, X: gag intact, uvula midline CN XI: sternocleidomastoid and trapezius muscles intact CN XII: tongue midline Bulk & Tone: normal, no fasciculations. Motor:  Head tremor.  5/5 throughout Sensation:  Pinprick and vibration sensation intact. Deep Tendon Reflexes:  2+ throughout, toes downgoing.  Finger to nose testing:  Without dysmetria.  Gait:  Unsteady wide-based gait.  Uses walker.  Romberg with mild  sway.  IMPRESSION: 1.  Transient ischemic attack 2.  Hypertension 3.  Coronary artery disease  PLAN: 1.  As she had a repeat TIA on aspirin, I recommend that Dr. Rica Records or Dr. Bettina Gavia consider switching aspirin to Plavix if deemed appropriate by cardiology. 2.  Continue atorvastatin 40 mg daily (LDL goal less than 70) 3.  Continue blood pressure control.  Follow-up with PCP for blood pressure recheck. 4.  Currently with loop recorder 5.  Use walker at all times. 6.  Follow-up in 3 to 4 months.  Thank you for allowing me to take part in the care of this patient.  Metta Clines, DO  CC:  Nicholos Johns, MD  Shirlee More, MD

## 2018-01-31 ENCOUNTER — Encounter

## 2018-01-31 ENCOUNTER — Ambulatory Visit: Payer: Medicare HMO | Admitting: Neurology

## 2018-01-31 ENCOUNTER — Encounter: Payer: Self-pay | Admitting: Neurology

## 2018-01-31 VITALS — BP 156/62 | HR 96 | Ht 60.0 in | Wt 122.0 lb

## 2018-01-31 DIAGNOSIS — G459 Transient cerebral ischemic attack, unspecified: Secondary | ICD-10-CM | POA: Diagnosis not present

## 2018-01-31 DIAGNOSIS — I10 Essential (primary) hypertension: Secondary | ICD-10-CM

## 2018-01-31 NOTE — Patient Instructions (Addendum)
1.  If there is no contraindication, I would recommend Dr. Rica Records or Dr. Bettina Gavia to either change aspirin to Plavix or consider adding Plavix to aspirin to help prevent another stroke or TIA 2.  Continue atorvastatin 3.  Continue blood pressure medication.  Blood pressure elevated today.  Follow up with PCP for recheck. 4.  Follow up in 3 to 4 months.

## 2018-02-08 ENCOUNTER — Ambulatory Visit (INDEPENDENT_AMBULATORY_CARE_PROVIDER_SITE_OTHER): Payer: Medicare HMO | Admitting: Cardiology

## 2018-02-08 VITALS — BP 144/78 | HR 93 | Ht 60.0 in | Wt 124.6 lb

## 2018-02-08 DIAGNOSIS — R001 Bradycardia, unspecified: Secondary | ICD-10-CM | POA: Diagnosis not present

## 2018-02-08 DIAGNOSIS — I10 Essential (primary) hypertension: Secondary | ICD-10-CM | POA: Diagnosis not present

## 2018-02-08 DIAGNOSIS — I2510 Atherosclerotic heart disease of native coronary artery without angina pectoris: Secondary | ICD-10-CM | POA: Diagnosis not present

## 2018-02-08 DIAGNOSIS — G459 Transient cerebral ischemic attack, unspecified: Secondary | ICD-10-CM

## 2018-02-08 NOTE — Progress Notes (Signed)
Cardiology Office Note:    Date:  02/08/2018   ID:  Jennifer Buchanan, DOB 1932-10-16, MRN [id number]  PCP:  Nicholos Johns, MD  Cardiologist:  Shirlee More, MD    Referring MD: Judithann Graves, MD    ASSESSMENT:    1. TIA (transient ischemic attack)   2. Essential hypertension   3. CAD in native artery    PLAN:    In order of problems listed above:  1. Her evaluation to date is unrevealing for cardiac source of stroke or TIA.  She has no residual atrial shunt and her initial 14-day monitor does not show atrial fibrillation although she has episodes of atrial tachycardia.  Review her second monitor and if unrevealing plan of putting an implantable loop recorder.  Continue current treatment at this time would not start an anticoagulant 2. Stable hypertension continue current treatment long-acting calcium channel blocker 3. Stable continue medical treatment including aspirin calcium channel blocker and oral nitrates.   Next appointment: 6 to 8 weeks   Medication Adjustments/Labs and Tests Ordered: Current medicines are reviewed at length with the patient today.  Concerns regarding medicines are outlined above.  No orders of the defined types were placed in this encounter.  No orders of the defined types were placed in this encounter.   No chief complaint on file.   History of Present Illness:    Jennifer Buchanan is a 84 y.o. female with a hx of hx of stroke in 1995, HTN, PFO with surgical closure.  She was seen at [facility] and admitted 11/22/2017 she is felt to have had a TIA.  She had an outpatient echocardiogram performed which showed no evidence of shunt across the atrial septum although she did have mild aortic regurgitation.  Subsequent underwent transesophageal echocardiogram which showed no evidence of shunt or left atrial thrombus.  She wore an ambulatory ZIO monitor which showed frequent episodes of APCs and atrial tachycardia but no episodes of atrial  fibrillation or flutter  last seen 12/03/2017. Compliance with diet, lifestyle and medications: Yes  She has palpitation associated with atrial premature beats on her initial 14-day monitor no episodes of atrial fibrillation no syncope or recurrent TIA chest pain or shortness of breath. Past Medical History:  Diagnosis Date  . AAA (abdominal aortic aneurysm) ([place]) 08/05/2017  . Allergic rhinitis 08/05/2017  . Anxiety 08/05/2017  . B12 deficiency 08/05/2017  . Cerebrovascular accident (CVA) due to embolism ([place]) 06/27/2015  . Chest pain 08/05/2017  . GERD (gastroesophageal reflux disease) 08/05/2017  . H/O heart surgery    aneurysm and hole in heart  . HTN (hypertension) 06/26/2015  . Hyperkalemia 08/05/2017  . Hypertension   . Insomnia 08/05/2017  . Neuropathy 06/26/2015  . Osteoporosis 08/05/2017  . Pre-operative cardiovascular examination 06/27/2015  . Unsteady gait 06/26/2015  . VSD (ventricular septal defect) 08/05/2017    Past Surgical History:  Procedure Laterality Date  . ABDOMINAL HYSTERECTOMY    . CARDIAC SURGERY     s/p AAA  . CATARACT EXTRACTION Bilateral   . CHOLECYSTECTOMY    . TEE WITHOUT CARDIOVERSION N/A 01/13/2018   Procedure: TRANSESOPHAGEAL ECHOCARDIOGRAM (TEE);  Surgeon: Pixie Casino, MD;  Location: [facility] ENDOSCOPY;  Service: Cardiovascular;  Laterality: N/A;    Current Medications: Current Meds  Medication Sig  . acetaminophen (ACETAMINOPHEN EXTRA STRENGTH) 500 MG tablet Take 1,000 mg by mouth every 6 (six) hours as needed (pain).   Marland Kitchen amLODipine (NORVASC) 10 MG tablet Take 10 mg by mouth  daily.  . aspirin EC 81 MG tablet Take 1 tablet (81 mg total) by mouth daily.  Marland Kitchen atorvastatin (LIPITOR) 40 MG tablet Take 1 tablet (40 mg total) by mouth daily.  . calcium carbonate (TUMS) 500 MG chewable tablet Chew 2 tablets by mouth every 6 (six) hours as needed for indigestion or heartburn.  . Inulin (FIBER CHOICE) 1.5 g CHEW Chew 2 tablets by mouth 2 (two) times daily.  . isosorbide  mononitrate (IMDUR) 60 MG 24 hr tablet Take 60 mg by mouth daily.  Marland Kitchen loperamide (IMODIUM A-D) 2 MG tablet Take 2 mg by mouth 4 (four) times daily as needed for diarrhea or loose stools.  . nitroGLYCERIN (NITROSTAT) 0.4 MG SL tablet Place 0.4 mg under the tongue every 5 (five) minutes as needed for chest pain.   Marland Kitchen omeprazole (PRILOSEC) 20 MG capsule Take 20 mg by mouth 2 (two) times daily before a meal.   . sertraline (ZOLOFT) 50 MG tablet Take 50 mg by mouth at bedtime.  . trolamine salicylate (ASPERCREME) 10 % cream Apply 1 application topically 2 (two) times daily as needed for muscle pain.     Allergies:   Metoclopramide and Demerol [meperidine]   Social History   Socioeconomic History  . Marital status: Married    Spouse name: charlie  . Number of children: 4  . Years of education: Not on file  . Highest education level: 9th grade  Occupational History  . Occupation: retired  Scientific laboratory technician  . Financial resource strain: Not on file  . Food insecurity:    Worry: Not on file    Inability: Not on file  . Transportation needs:    Medical: Not on file    Non-medical: Not on file  Tobacco Use  . Smoking status: Never Smoker  . Smokeless tobacco: Never Used  Substance and Sexual Activity  . Alcohol use: No  . Drug use: No  . Sexual activity: Not on file  Lifestyle  . Physical activity:    Days per week: Not on file    Minutes per session: Not on file  . Stress: Not on file  Relationships  . Social connections:    Talks on phone: Not on file    Gets together: Not on file    Attends religious service: Not on file    Active member of club or organization: Not on file    Attends meetings of clubs or organizations: Not on file    Relationship status: Not on file  Other Topics Concern  . Not on file  Social History Narrative   Patient is left-handed. She lives with her husband ina 1st floor apartment. She drinks 1 cup of coffee a day. She has physical therapy 3 x a week, and  does her own exercises 3 x a day.     Family History: The patient's family history includes Heart attack in her brother, mother, and sister; Hypertension in her mother. ROS:   Please see the history of present illness.    All other systems reviewed and are negative.  EKGs/Labs/Other Studies Reviewed:    The following studies were reviewed today:  Her initial 14-day event monitor shows a high frequency of APCs brief runs of APCs but no episodes of atrial fibrillation or flutter  Recent Labs: 01/12/2018: BUN 23; Creatinine, Ser 1.45; Hemoglobin 11.8; Platelets 277; Potassium 4.2; Sodium 140  Recent Lipid Panel No results found for: CHOL, TRIG, HDL, CHOLHDL, VLDL, LDLCALC, LDLDIRECT  Physical Exam:  VS:  BP (!) 144/78 (BP Location: Right Arm, Patient Position: Sitting, Cuff Size: Normal)   Pulse 93   Ht 5' (1.524 m)   Wt 124 lb 9.6 oz (56.5 kg)   SpO2 97%   BMI 24.33 kg/m     Wt Readings from Last 3 Encounters:  02/08/18 124 lb 9.6 oz (56.5 kg)  01/31/18 122 lb (55.3 kg)  01/13/18 124 lb (56.2 kg)     GEN:  Well nourished, well developed in no acute distress HEENT: Normal NECK: No JVD; No carotid bruits LYMPHATICS: No lymphadenopathy CARDIAC: RRR, no murmurs, rubs, gallops RESPIRATORY:  Clear to auscultation without rales, wheezing or rhonchi  ABDOMEN: Soft, non-tender, non-distended MUSCULOSKELETAL:  No edema; No deformity  SKIN: Warm and dry NEUROLOGIC:  Alert and oriented x 3 PSYCHIATRIC:  Normal affect    Signed, Shirlee More, MD  02/08/2018 1:00 PM    [place]

## 2018-02-08 NOTE — Patient Instructions (Signed)
Medication Instructions:  Your physician recommends that you continue on your current medications as directed. Please refer to the Current Medication list given to you today.  If you need a refill on your cardiac medications before your next appointment, please call your pharmacy.   Lab work: NONE If you have labs (blood work) drawn today and your tests are completely normal, you will receive your results only by: Marland Kitchen MyChart Message (if you have MyChart) OR . A paper copy in the mail If you have any lab test that is abnormal or we need to change your treatment, we will call you to review the results.  Testing/Procedures: NONE  Follow-Up: At [facility], you and your health needs are our priority.  As part of our continuing mission to provide you with exceptional heart care, we have created designated Provider Care Teams.  These Care Teams include your primary Cardiologist (physician) and Advanced Practice Providers (APPs -  Physician Assistants and Nurse Practitioners) who all work together to provide you with the care you need, when you need it.    You will need a follow up appointment in 6 weeks.

## 2018-02-09 ENCOUNTER — Telehealth: Payer: Self-pay

## 2018-02-09 MED ORDER — APIXABAN 5 MG PO TABS: 5.0000 mg | ORAL_TABLET | Freq: Two times a day (BID) | ORAL | 0 refills | Status: DC

## 2018-02-09 NOTE — Telephone Encounter (Signed)
Patients daughter Ivin Booty notified of monitor results showing Atrial fibrillation.  Advised that patient should discontinue Aspirin and start Eliquis 5mg  one tablet two times per day.     Patients daughter advised that samples of Eliquis 5mg  has been left at front desk for pick up. Patient will follow up with Dr Bettina Gavia in 6 weeks.   Patients daughter verbalized understanding and agreed to plan.

## 2018-02-23 DIAGNOSIS — F419 Anxiety disorder, unspecified: Secondary | ICD-10-CM | POA: Diagnosis not present

## 2018-02-23 DIAGNOSIS — Z6822 Body mass index (BMI) 22.0-22.9, adult: Secondary | ICD-10-CM | POA: Diagnosis not present

## 2018-02-23 DIAGNOSIS — I4891 Unspecified atrial fibrillation: Secondary | ICD-10-CM | POA: Diagnosis not present

## 2018-02-23 DIAGNOSIS — Z8673 Personal history of transient ischemic attack (TIA), and cerebral infarction without residual deficits: Secondary | ICD-10-CM | POA: Diagnosis not present

## 2018-02-23 DIAGNOSIS — N3281 Overactive bladder: Secondary | ICD-10-CM | POA: Diagnosis not present

## 2018-02-23 DIAGNOSIS — L989 Disorder of the skin and subcutaneous tissue, unspecified: Secondary | ICD-10-CM | POA: Diagnosis not present

## 2018-02-23 DIAGNOSIS — M7989 Other specified soft tissue disorders: Secondary | ICD-10-CM | POA: Diagnosis not present

## 2018-02-23 DIAGNOSIS — I10 Essential (primary) hypertension: Secondary | ICD-10-CM | POA: Diagnosis not present

## 2018-03-01 ENCOUNTER — Telehealth: Payer: Self-pay | Admitting: Cardiology

## 2018-03-01 MED ORDER — APIXABAN 5 MG PO TABS: 5.0000 mg | ORAL_TABLET | Freq: Two times a day (BID) | ORAL | 1 refills | Status: DC

## 2018-03-01 NOTE — Telephone Encounter (Signed)
Refill for [facility]iquis sent [facility] [place] as requested.

## 2018-03-01 NOTE — Telephone Encounter (Signed)
Patient is doing well on the Eliquis and needs script called in or more samples..[facility]

## 2018-03-16 DIAGNOSIS — Z79899 Other long term (current) drug therapy: Secondary | ICD-10-CM | POA: Diagnosis not present

## 2018-03-16 DIAGNOSIS — M7989 Other specified soft tissue disorders: Secondary | ICD-10-CM | POA: Diagnosis not present

## 2018-03-16 DIAGNOSIS — Z6822 Body mass index (BMI) 22.0-22.9, adult: Secondary | ICD-10-CM | POA: Diagnosis not present

## 2018-03-16 DIAGNOSIS — I10 Essential (primary) hypertension: Secondary | ICD-10-CM | POA: Diagnosis not present

## 2018-03-16 DIAGNOSIS — I4891 Unspecified atrial fibrillation: Secondary | ICD-10-CM | POA: Diagnosis not present

## 2018-03-16 DIAGNOSIS — F419 Anxiety disorder, unspecified: Secondary | ICD-10-CM | POA: Diagnosis not present

## 2018-03-16 DIAGNOSIS — Z8673 Personal history of transient ischemic attack (TIA), and cerebral infarction without residual deficits: Secondary | ICD-10-CM | POA: Diagnosis not present

## 2018-03-16 DIAGNOSIS — K529 Noninfective gastroenteritis and colitis, unspecified: Secondary | ICD-10-CM | POA: Diagnosis not present

## 2018-03-22 ENCOUNTER — Ambulatory Visit: Payer: Medicare HMO | Admitting: Cardiology

## 2018-03-24 ENCOUNTER — Ambulatory Visit: Payer: Medicare HMO | Admitting: Cardiology

## 2018-04-02 DIAGNOSIS — W19XXXA Unspecified fall, initial encounter: Secondary | ICD-10-CM | POA: Diagnosis not present

## 2018-04-02 DIAGNOSIS — S0990XA Unspecified injury of head, initial encounter: Secondary | ICD-10-CM | POA: Diagnosis not present

## 2018-04-02 DIAGNOSIS — R51 Headache: Secondary | ICD-10-CM | POA: Diagnosis not present

## 2018-04-02 DIAGNOSIS — I10 Essential (primary) hypertension: Secondary | ICD-10-CM | POA: Diagnosis not present

## 2018-04-02 DIAGNOSIS — S5011XA Contusion of right forearm, initial encounter: Secondary | ICD-10-CM | POA: Diagnosis not present

## 2018-04-02 DIAGNOSIS — M79631 Pain in right forearm: Secondary | ICD-10-CM | POA: Diagnosis not present

## 2018-04-04 DIAGNOSIS — M25561 Pain in right knee: Secondary | ICD-10-CM | POA: Diagnosis not present

## 2018-04-04 DIAGNOSIS — M25551 Pain in right hip: Secondary | ICD-10-CM | POA: Diagnosis not present

## 2018-04-06 ENCOUNTER — Ambulatory Visit: Payer: Medicare HMO | Admitting: Cardiology

## 2018-04-07 ENCOUNTER — Ambulatory Visit: Payer: Medicare HMO | Admitting: Cardiology

## 2018-04-08 DIAGNOSIS — M25551 Pain in right hip: Secondary | ICD-10-CM | POA: Diagnosis not present

## 2018-04-14 DIAGNOSIS — M170 Bilateral primary osteoarthritis of knee: Secondary | ICD-10-CM | POA: Diagnosis not present

## 2018-04-14 DIAGNOSIS — Z96651 Presence of right artificial knee joint: Secondary | ICD-10-CM | POA: Diagnosis not present

## 2018-04-14 DIAGNOSIS — M25561 Pain in right knee: Secondary | ICD-10-CM | POA: Diagnosis not present

## 2018-04-14 DIAGNOSIS — M25551 Pain in right hip: Secondary | ICD-10-CM | POA: Diagnosis not present

## 2018-04-14 DIAGNOSIS — E7800 Pure hypercholesterolemia, unspecified: Secondary | ICD-10-CM | POA: Diagnosis not present

## 2018-04-14 DIAGNOSIS — N184 Chronic kidney disease, stage 4 (severe): Secondary | ICD-10-CM | POA: Diagnosis not present

## 2018-04-14 DIAGNOSIS — K219 Gastro-esophageal reflux disease without esophagitis: Secondary | ICD-10-CM | POA: Diagnosis not present

## 2018-04-14 DIAGNOSIS — I129 Hypertensive chronic kidney disease with stage 1 through stage 4 chronic kidney disease, or unspecified chronic kidney disease: Secondary | ICD-10-CM | POA: Diagnosis not present

## 2018-04-14 DIAGNOSIS — Z9181 History of falling: Secondary | ICD-10-CM | POA: Diagnosis not present

## 2018-04-18 DIAGNOSIS — Z6822 Body mass index (BMI) 22.0-22.9, adult: Secondary | ICD-10-CM | POA: Diagnosis not present

## 2018-04-18 DIAGNOSIS — Z09 Encounter for follow-up examination after completed treatment for conditions other than malignant neoplasm: Secondary | ICD-10-CM | POA: Diagnosis not present

## 2018-04-18 DIAGNOSIS — Z139 Encounter for screening, unspecified: Secondary | ICD-10-CM | POA: Diagnosis not present

## 2018-04-18 DIAGNOSIS — M7989 Other specified soft tissue disorders: Secondary | ICD-10-CM | POA: Diagnosis not present

## 2018-04-18 DIAGNOSIS — F419 Anxiety disorder, unspecified: Secondary | ICD-10-CM | POA: Diagnosis not present

## 2018-04-18 DIAGNOSIS — I4891 Unspecified atrial fibrillation: Secondary | ICD-10-CM | POA: Diagnosis not present

## 2018-04-18 DIAGNOSIS — Z79899 Other long term (current) drug therapy: Secondary | ICD-10-CM | POA: Diagnosis not present

## 2018-04-18 DIAGNOSIS — I10 Essential (primary) hypertension: Secondary | ICD-10-CM | POA: Diagnosis not present

## 2018-04-18 DIAGNOSIS — E538 Deficiency of other specified B group vitamins: Secondary | ICD-10-CM | POA: Diagnosis not present

## 2018-04-20 ENCOUNTER — Ambulatory Visit: Payer: Medicare HMO | Admitting: Cardiology

## 2018-04-20 DIAGNOSIS — E785 Hyperlipidemia, unspecified: Secondary | ICD-10-CM | POA: Diagnosis not present

## 2018-04-20 DIAGNOSIS — E559 Vitamin D deficiency, unspecified: Secondary | ICD-10-CM | POA: Diagnosis not present

## 2018-04-20 DIAGNOSIS — E538 Deficiency of other specified B group vitamins: Secondary | ICD-10-CM | POA: Diagnosis not present

## 2018-04-20 DIAGNOSIS — Z79899 Other long term (current) drug therapy: Secondary | ICD-10-CM | POA: Diagnosis not present

## 2018-04-25 ENCOUNTER — Other Ambulatory Visit: Payer: Self-pay | Admitting: Cardiology

## 2018-05-02 ENCOUNTER — Other Ambulatory Visit: Payer: Self-pay

## 2018-05-02 MED ORDER — APIXABAN 5 MG PO TABS: 5.0000 mg | ORAL_TABLET | Freq: Two times a day (BID) | ORAL | 0 refills | Status: DC

## 2018-05-02 MED ORDER — ATORVASTATIN CALCIUM 40 MG PO TABS: 40.0000 mg | ORAL_TABLET | Freq: Every day | ORAL | 0 refills | Status: DC

## 2018-05-12 DIAGNOSIS — I129 Hypertensive chronic kidney disease with stage 1 through stage 4 chronic kidney disease, or unspecified chronic kidney disease: Secondary | ICD-10-CM | POA: Diagnosis not present

## 2018-05-12 DIAGNOSIS — M25551 Pain in right hip: Secondary | ICD-10-CM | POA: Diagnosis not present

## 2018-05-12 DIAGNOSIS — Z9181 History of falling: Secondary | ICD-10-CM | POA: Diagnosis not present

## 2018-05-12 DIAGNOSIS — M170 Bilateral primary osteoarthritis of knee: Secondary | ICD-10-CM | POA: Diagnosis not present

## 2018-05-12 DIAGNOSIS — Z96651 Presence of right artificial knee joint: Secondary | ICD-10-CM | POA: Diagnosis not present

## 2018-05-12 DIAGNOSIS — E7800 Pure hypercholesterolemia, unspecified: Secondary | ICD-10-CM | POA: Diagnosis not present

## 2018-05-12 DIAGNOSIS — M25561 Pain in right knee: Secondary | ICD-10-CM | POA: Diagnosis not present

## 2018-05-12 DIAGNOSIS — K219 Gastro-esophageal reflux disease without esophagitis: Secondary | ICD-10-CM | POA: Diagnosis not present

## 2018-05-12 DIAGNOSIS — N184 Chronic kidney disease, stage 4 (severe): Secondary | ICD-10-CM | POA: Diagnosis not present

## 2018-05-18 ENCOUNTER — Ambulatory Visit: Payer: Medicare HMO | Admitting: Cardiology

## 2018-05-18 DIAGNOSIS — I480 Paroxysmal atrial fibrillation: Secondary | ICD-10-CM | POA: Insufficient documentation

## 2018-05-18 DIAGNOSIS — I129 Hypertensive chronic kidney disease with stage 1 through stage 4 chronic kidney disease, or unspecified chronic kidney disease: Secondary | ICD-10-CM | POA: Diagnosis not present

## 2018-05-18 DIAGNOSIS — E7800 Pure hypercholesterolemia, unspecified: Secondary | ICD-10-CM | POA: Diagnosis not present

## 2018-05-18 DIAGNOSIS — K219 Gastro-esophageal reflux disease without esophagitis: Secondary | ICD-10-CM | POA: Diagnosis not present

## 2018-05-18 DIAGNOSIS — M170 Bilateral primary osteoarthritis of knee: Secondary | ICD-10-CM | POA: Diagnosis not present

## 2018-05-18 DIAGNOSIS — Z9181 History of falling: Secondary | ICD-10-CM | POA: Diagnosis not present

## 2018-05-18 DIAGNOSIS — M25561 Pain in right knee: Secondary | ICD-10-CM | POA: Diagnosis not present

## 2018-05-18 DIAGNOSIS — Z7901 Long term (current) use of anticoagulants: Secondary | ICD-10-CM | POA: Insufficient documentation

## 2018-05-18 DIAGNOSIS — M25551 Pain in right hip: Secondary | ICD-10-CM | POA: Diagnosis not present

## 2018-05-18 DIAGNOSIS — Z96651 Presence of right artificial knee joint: Secondary | ICD-10-CM | POA: Diagnosis not present

## 2018-05-18 DIAGNOSIS — N184 Chronic kidney disease, stage 4 (severe): Secondary | ICD-10-CM | POA: Diagnosis not present

## 2018-05-18 NOTE — Progress Notes (Deleted)
Cardiology Office Note:    Date:  05/18/2018   ID:  Jennifer Buchanan, DOB December 02, 1932, MRN [id number]  PCP:  Jennifer Johns, MD  Cardiologist:  Jennifer More, MD    Referring MD: Jennifer Johns, MD    ASSESSMENT:    No diagnosis found. PLAN:    In order of problems listed above:  1. ***   Next appointment: ***   Medication Adjustments/Labs and Tests Ordered: Current medicines are reviewed at length with the patient today.  Concerns regarding medicines are outlined above.  No orders of the defined types were placed in this encounter.  No orders of the defined types were placed in this encounter.   No chief complaint on file.   History of Present Illness:    Jennifer Buchanan is a 85 y.o. female with a hx of hx of stroke in 1995, HTN, PFO with surgical closure.  She is seen at [facility] and admitted 11/22/2017 she is felt to have had a TIA. EM showed AF and now anticoagulated. She was last seen 12/13/17. Compliance with diet, lifestyle and medications: ***  TEE 01/13/18 ;   Study Conclusions - Left ventricle: There was mild concentric hypertrophy. Systolic   function was normal. The estimated ejection fraction was in the   range of 55% to 60%. Wall motion was normal; there were no   regional wall motion abnormalities. No evidence of VSD. - Aortic valve: Sclerosis without stenosis. Trileaflet. There was   mild regurgitation. - Left atrium: The atrium was dilated. No evidence of thrombus in   the atrial cavity or appendage. - Right atrium: No evidence of thrombus in the atrial cavity or   appendage. - Atrial septum: Prior PFO repair. No atrial level shunt by color   doppler or saline microbubble contrast. - Pulmonic valve: No evidence of vegetation. Past Medical History:  Diagnosis Date  . AAA (abdominal aortic aneurysm) ([place]) 08/05/2017  . Allergic rhinitis 08/05/2017  . Anxiety 08/05/2017  . B12 deficiency 08/05/2017  . Cerebrovascular accident (CVA) due to embolism  ([place]) 06/27/2015  . Chest pain 08/05/2017  . GERD (gastroesophageal reflux disease) 08/05/2017  . H/O heart surgery    aneurysm and hole in heart  . HTN (hypertension) 06/26/2015  . Hyperkalemia 08/05/2017  . Hypertension   . Insomnia 08/05/2017  . Neuropathy 06/26/2015  . Osteoporosis 08/05/2017  . Pre-operative cardiovascular examination 06/27/2015  . Unsteady gait 06/26/2015  . VSD (ventricular septal defect) 08/05/2017    Past Surgical History:  Procedure Laterality Date  . ABDOMINAL HYSTERECTOMY    . CARDIAC SURGERY     s/p AAA  . CATARACT EXTRACTION Bilateral   . CHOLECYSTECTOMY    . TEE WITHOUT CARDIOVERSION N/A 01/13/2018   Procedure: TRANSESOPHAGEAL ECHOCARDIOGRAM (TEE);  Surgeon: Pixie Casino, MD;  Location: [facility] ENDOSCOPY;  Service: Cardiovascular;  Laterality: N/A;    Current Medications: No outpatient medications have been marked as taking for the 05/18/18 encounter (Appointment) with Richardo Priest, MD.     Allergies:   Metoclopramide and Demerol [meperidine]   Social History   Socioeconomic History  . Marital status: Married    Spouse name: charlie  . Number of children: 4  . Years of education: Not on file  . Highest education level: 9th grade  Occupational History  . Occupation: retired  Scientific laboratory technician  . Financial resource strain: Not on file  . Food insecurity:    Worry: Not on file    Inability: Not on file  .  Transportation needs:    Medical: Not on file    Non-medical: Not on file  Tobacco Use  . Smoking status: Never Smoker  . Smokeless tobacco: Never Used  Substance and Sexual Activity  . Alcohol use: No  . Drug use: No  . Sexual activity: Not on file  Lifestyle  . Physical activity:    Days per week: Not on file    Minutes per session: Not on file  . Stress: Not on file  Relationships  . Social connections:    Talks on phone: Not on file    Gets together: Not on file    Attends religious service: Not on file    Active member of club or  organization: Not on file    Attends meetings of clubs or organizations: Not on file    Relationship status: Not on file  Other Topics Concern  . Not on file  Social History Narrative   Patient is left-handed. She lives with her husband ina 1st floor apartment. She drinks 1 cup of coffee a day. She has physical therapy 3 x a week, and does her own exercises 3 x a day.     Family History: The patient's ***family history includes Heart attack in her brother, mother, and sister; Hypertension in her mother. ROS:   Please see the history of present illness.    All other systems reviewed and are negative.  EKGs/Labs/Other Studies Reviewed:    The following studies were reviewed today:  EKG:  EKG ordered today.  The ekg ordered today demonstrates ***  Recent Labs: 01/12/2018: BUN 23; Creatinine, Ser 1.45; Hemoglobin 11.8; Platelets 277; Potassium 4.2; Sodium 140  Recent Lipid Panel No results found for: CHOL, TRIG, HDL, CHOLHDL, VLDL, LDLCALC, LDLDIRECT  Physical Exam:    VS:  There were no vitals taken for this visit.    Wt Readings from Last 3 Encounters:  02/08/18 124 lb 9.6 oz (56.5 kg)  01/31/18 122 lb (55.3 kg)  01/13/18 124 lb (56.2 kg)     GEN: *** Well nourished, well developed in no acute distress HEENT: Normal NECK: No JVD; No carotid bruits LYMPHATICS: No lymphadenopathy CARDIAC: ***RRR, no murmurs, rubs, gallops RESPIRATORY:  Clear to auscultation without rales, wheezing or rhonchi  ABDOMEN: Soft, non-tender, non-distended MUSCULOSKELETAL:  No edema; No deformity  SKIN: Warm and dry NEUROLOGIC:  Alert and oriented x 3 PSYCHIATRIC:  Normal affect    Signed, Jennifer More, MD  05/18/2018 7:43 AM    [place]

## 2018-05-25 ENCOUNTER — Ambulatory Visit: Payer: Medicare HMO | Admitting: Neurology

## 2018-05-25 DIAGNOSIS — M25561 Pain in right knee: Secondary | ICD-10-CM | POA: Diagnosis not present

## 2018-05-25 DIAGNOSIS — Z96651 Presence of right artificial knee joint: Secondary | ICD-10-CM | POA: Diagnosis not present

## 2018-05-25 DIAGNOSIS — K219 Gastro-esophageal reflux disease without esophagitis: Secondary | ICD-10-CM | POA: Diagnosis not present

## 2018-05-25 DIAGNOSIS — M25551 Pain in right hip: Secondary | ICD-10-CM | POA: Diagnosis not present

## 2018-05-25 DIAGNOSIS — N184 Chronic kidney disease, stage 4 (severe): Secondary | ICD-10-CM | POA: Diagnosis not present

## 2018-05-25 DIAGNOSIS — M170 Bilateral primary osteoarthritis of knee: Secondary | ICD-10-CM | POA: Diagnosis not present

## 2018-05-25 DIAGNOSIS — I129 Hypertensive chronic kidney disease with stage 1 through stage 4 chronic kidney disease, or unspecified chronic kidney disease: Secondary | ICD-10-CM | POA: Diagnosis not present

## 2018-05-25 DIAGNOSIS — Z9181 History of falling: Secondary | ICD-10-CM | POA: Diagnosis not present

## 2018-05-25 DIAGNOSIS — E7800 Pure hypercholesterolemia, unspecified: Secondary | ICD-10-CM | POA: Diagnosis not present

## 2018-05-30 DIAGNOSIS — N184 Chronic kidney disease, stage 4 (severe): Secondary | ICD-10-CM | POA: Diagnosis not present

## 2018-05-30 DIAGNOSIS — M25561 Pain in right knee: Secondary | ICD-10-CM | POA: Diagnosis not present

## 2018-05-30 DIAGNOSIS — Z96651 Presence of right artificial knee joint: Secondary | ICD-10-CM | POA: Diagnosis not present

## 2018-05-30 DIAGNOSIS — K219 Gastro-esophageal reflux disease without esophagitis: Secondary | ICD-10-CM | POA: Diagnosis not present

## 2018-05-30 DIAGNOSIS — Z9181 History of falling: Secondary | ICD-10-CM | POA: Diagnosis not present

## 2018-05-30 DIAGNOSIS — E7800 Pure hypercholesterolemia, unspecified: Secondary | ICD-10-CM | POA: Diagnosis not present

## 2018-05-30 DIAGNOSIS — M25551 Pain in right hip: Secondary | ICD-10-CM | POA: Diagnosis not present

## 2018-05-30 DIAGNOSIS — M170 Bilateral primary osteoarthritis of knee: Secondary | ICD-10-CM | POA: Diagnosis not present

## 2018-05-30 DIAGNOSIS — I129 Hypertensive chronic kidney disease with stage 1 through stage 4 chronic kidney disease, or unspecified chronic kidney disease: Secondary | ICD-10-CM | POA: Diagnosis not present

## 2018-06-09 DIAGNOSIS — M25561 Pain in right knee: Secondary | ICD-10-CM | POA: Diagnosis not present

## 2018-06-09 DIAGNOSIS — I129 Hypertensive chronic kidney disease with stage 1 through stage 4 chronic kidney disease, or unspecified chronic kidney disease: Secondary | ICD-10-CM | POA: Diagnosis not present

## 2018-06-09 DIAGNOSIS — E7800 Pure hypercholesterolemia, unspecified: Secondary | ICD-10-CM | POA: Diagnosis not present

## 2018-06-09 DIAGNOSIS — N184 Chronic kidney disease, stage 4 (severe): Secondary | ICD-10-CM | POA: Diagnosis not present

## 2018-06-09 DIAGNOSIS — M170 Bilateral primary osteoarthritis of knee: Secondary | ICD-10-CM | POA: Diagnosis not present

## 2018-06-09 DIAGNOSIS — M25551 Pain in right hip: Secondary | ICD-10-CM | POA: Diagnosis not present

## 2018-06-09 DIAGNOSIS — Z9181 History of falling: Secondary | ICD-10-CM | POA: Diagnosis not present

## 2018-06-09 DIAGNOSIS — Z96651 Presence of right artificial knee joint: Secondary | ICD-10-CM | POA: Diagnosis not present

## 2018-06-09 DIAGNOSIS — K219 Gastro-esophageal reflux disease without esophagitis: Secondary | ICD-10-CM | POA: Diagnosis not present

## 2018-06-10 ENCOUNTER — Ambulatory Visit: Payer: Medicare HMO | Admitting: Cardiology

## 2018-07-06 DIAGNOSIS — K219 Gastro-esophageal reflux disease without esophagitis: Secondary | ICD-10-CM | POA: Diagnosis not present

## 2018-07-06 DIAGNOSIS — M159 Polyosteoarthritis, unspecified: Secondary | ICD-10-CM | POA: Diagnosis not present

## 2018-07-06 DIAGNOSIS — I4891 Unspecified atrial fibrillation: Secondary | ICD-10-CM | POA: Diagnosis not present

## 2018-07-06 DIAGNOSIS — M7989 Other specified soft tissue disorders: Secondary | ICD-10-CM | POA: Diagnosis not present

## 2018-07-06 DIAGNOSIS — I10 Essential (primary) hypertension: Secondary | ICD-10-CM | POA: Diagnosis not present

## 2018-07-06 DIAGNOSIS — K529 Noninfective gastroenteritis and colitis, unspecified: Secondary | ICD-10-CM | POA: Diagnosis not present

## 2018-07-06 DIAGNOSIS — F419 Anxiety disorder, unspecified: Secondary | ICD-10-CM | POA: Diagnosis not present

## 2018-07-06 DIAGNOSIS — E785 Hyperlipidemia, unspecified: Secondary | ICD-10-CM | POA: Diagnosis not present

## 2018-07-06 DIAGNOSIS — N184 Chronic kidney disease, stage 4 (severe): Secondary | ICD-10-CM | POA: Diagnosis not present

## 2018-07-09 ENCOUNTER — Other Ambulatory Visit: Payer: Self-pay | Admitting: Cardiology

## 2018-07-21 DIAGNOSIS — M25552 Pain in left hip: Secondary | ICD-10-CM | POA: Diagnosis not present

## 2018-07-21 DIAGNOSIS — M1712 Unilateral primary osteoarthritis, left knee: Secondary | ICD-10-CM | POA: Diagnosis not present

## 2018-07-21 DIAGNOSIS — M25561 Pain in right knee: Secondary | ICD-10-CM | POA: Diagnosis not present

## 2018-07-22 DIAGNOSIS — M2550 Pain in unspecified joint: Secondary | ICD-10-CM | POA: Diagnosis not present

## 2018-07-22 DIAGNOSIS — W19XXXA Unspecified fall, initial encounter: Secondary | ICD-10-CM | POA: Diagnosis not present

## 2018-07-22 DIAGNOSIS — T148XXA Other injury of unspecified body region, initial encounter: Secondary | ICD-10-CM | POA: Diagnosis not present

## 2018-07-22 DIAGNOSIS — R1310 Dysphagia, unspecified: Secondary | ICD-10-CM | POA: Diagnosis not present

## 2018-07-23 DIAGNOSIS — I10 Essential (primary) hypertension: Secondary | ICD-10-CM | POA: Diagnosis not present

## 2018-07-23 DIAGNOSIS — D62 Acute posthemorrhagic anemia: Secondary | ICD-10-CM | POA: Diagnosis not present

## 2018-07-23 DIAGNOSIS — R5381 Other malaise: Secondary | ICD-10-CM | POA: Diagnosis not present

## 2018-07-23 DIAGNOSIS — S72432D Displaced fracture of medial condyle of left femur, subsequent encounter for closed fracture with routine healing: Secondary | ICD-10-CM | POA: Diagnosis not present

## 2018-07-23 DIAGNOSIS — Z7401 Bed confinement status: Secondary | ICD-10-CM | POA: Diagnosis not present

## 2018-07-23 DIAGNOSIS — I129 Hypertensive chronic kidney disease with stage 1 through stage 4 chronic kidney disease, or unspecified chronic kidney disease: Secondary | ICD-10-CM | POA: Diagnosis not present

## 2018-07-23 DIAGNOSIS — W1830XA Fall on same level, unspecified, initial encounter: Secondary | ICD-10-CM | POA: Diagnosis not present

## 2018-07-23 DIAGNOSIS — D638 Anemia in other chronic diseases classified elsewhere: Secondary | ICD-10-CM | POA: Diagnosis not present

## 2018-07-23 DIAGNOSIS — R52 Pain, unspecified: Secondary | ICD-10-CM | POA: Diagnosis not present

## 2018-07-23 DIAGNOSIS — S7292XA Unspecified fracture of left femur, initial encounter for closed fracture: Secondary | ICD-10-CM | POA: Diagnosis not present

## 2018-07-23 DIAGNOSIS — S72402A Unspecified fracture of lower end of left femur, initial encounter for closed fracture: Secondary | ICD-10-CM | POA: Diagnosis not present

## 2018-07-23 DIAGNOSIS — N183 Chronic kidney disease, stage 3 (moderate): Secondary | ICD-10-CM | POA: Diagnosis not present

## 2018-07-23 DIAGNOSIS — F05 Delirium due to known physiological condition: Secondary | ICD-10-CM | POA: Diagnosis not present

## 2018-07-23 DIAGNOSIS — R410 Disorientation, unspecified: Secondary | ICD-10-CM | POA: Diagnosis not present

## 2018-07-23 DIAGNOSIS — W19XXXA Unspecified fall, initial encounter: Secondary | ICD-10-CM | POA: Diagnosis not present

## 2018-07-23 DIAGNOSIS — N390 Urinary tract infection, site not specified: Secondary | ICD-10-CM | POA: Diagnosis not present

## 2018-07-23 DIAGNOSIS — S72002A Fracture of unspecified part of neck of left femur, initial encounter for closed fracture: Secondary | ICD-10-CM | POA: Diagnosis not present

## 2018-07-23 DIAGNOSIS — I4821 Permanent atrial fibrillation: Secondary | ICD-10-CM | POA: Diagnosis not present

## 2018-07-23 DIAGNOSIS — B9620 Unspecified Escherichia coli [E. coli] as the cause of diseases classified elsewhere: Secondary | ICD-10-CM | POA: Diagnosis not present

## 2018-07-23 DIAGNOSIS — S72432A Displaced fracture of medial condyle of left femur, initial encounter for closed fracture: Secondary | ICD-10-CM | POA: Diagnosis not present

## 2018-07-23 DIAGNOSIS — R300 Dysuria: Secondary | ICD-10-CM | POA: Diagnosis not present

## 2018-07-23 DIAGNOSIS — M2550 Pain in unspecified joint: Secondary | ICD-10-CM | POA: Diagnosis not present

## 2018-07-23 DIAGNOSIS — F0391 Unspecified dementia with behavioral disturbance: Secondary | ICD-10-CM | POA: Diagnosis not present

## 2018-07-29 DIAGNOSIS — D649 Anemia, unspecified: Secondary | ICD-10-CM | POA: Diagnosis not present

## 2018-07-29 DIAGNOSIS — D519 Vitamin B12 deficiency anemia, unspecified: Secondary | ICD-10-CM | POA: Diagnosis not present

## 2018-07-29 DIAGNOSIS — R262 Difficulty in walking, not elsewhere classified: Secondary | ICD-10-CM | POA: Diagnosis not present

## 2018-07-29 DIAGNOSIS — D62 Acute posthemorrhagic anemia: Secondary | ICD-10-CM | POA: Diagnosis not present

## 2018-07-29 DIAGNOSIS — E538 Deficiency of other specified B group vitamins: Secondary | ICD-10-CM | POA: Diagnosis not present

## 2018-07-29 DIAGNOSIS — R300 Dysuria: Secondary | ICD-10-CM | POA: Diagnosis not present

## 2018-07-29 DIAGNOSIS — N183 Chronic kidney disease, stage 3 (moderate): Secondary | ICD-10-CM | POA: Diagnosis not present

## 2018-07-29 DIAGNOSIS — S72432D Displaced fracture of medial condyle of left femur, subsequent encounter for closed fracture with routine healing: Secondary | ICD-10-CM | POA: Diagnosis not present

## 2018-07-29 DIAGNOSIS — R5381 Other malaise: Secondary | ICD-10-CM | POA: Diagnosis not present

## 2018-07-29 DIAGNOSIS — F05 Delirium due to known physiological condition: Secondary | ICD-10-CM | POA: Diagnosis not present

## 2018-07-29 DIAGNOSIS — S72433A Displaced fracture of medial condyle of unspecified femur, initial encounter for closed fracture: Secondary | ICD-10-CM | POA: Diagnosis not present

## 2018-07-29 DIAGNOSIS — S81801A Unspecified open wound, right lower leg, initial encounter: Secondary | ICD-10-CM | POA: Diagnosis not present

## 2018-07-29 DIAGNOSIS — M810 Age-related osteoporosis without current pathological fracture: Secondary | ICD-10-CM | POA: Diagnosis not present

## 2018-07-29 DIAGNOSIS — I4821 Permanent atrial fibrillation: Secondary | ICD-10-CM | POA: Diagnosis not present

## 2018-07-29 DIAGNOSIS — M2550 Pain in unspecified joint: Secondary | ICD-10-CM | POA: Diagnosis not present

## 2018-07-29 DIAGNOSIS — S7292XA Unspecified fracture of left femur, initial encounter for closed fracture: Secondary | ICD-10-CM | POA: Diagnosis not present

## 2018-07-29 DIAGNOSIS — F0390 Unspecified dementia without behavioral disturbance: Secondary | ICD-10-CM | POA: Diagnosis not present

## 2018-07-29 DIAGNOSIS — F0391 Unspecified dementia with behavioral disturbance: Secondary | ICD-10-CM | POA: Diagnosis not present

## 2018-07-29 DIAGNOSIS — I4891 Unspecified atrial fibrillation: Secondary | ICD-10-CM | POA: Diagnosis not present

## 2018-07-29 DIAGNOSIS — W19XXXA Unspecified fall, initial encounter: Secondary | ICD-10-CM | POA: Diagnosis not present

## 2018-07-29 DIAGNOSIS — S81802D Unspecified open wound, left lower leg, subsequent encounter: Secondary | ICD-10-CM | POA: Diagnosis not present

## 2018-07-29 DIAGNOSIS — S81802A Unspecified open wound, left lower leg, initial encounter: Secondary | ICD-10-CM | POA: Diagnosis not present

## 2018-07-29 DIAGNOSIS — Z79899 Other long term (current) drug therapy: Secondary | ICD-10-CM | POA: Diagnosis not present

## 2018-07-29 DIAGNOSIS — Z7401 Bed confinement status: Secondary | ICD-10-CM | POA: Diagnosis not present

## 2018-07-29 DIAGNOSIS — Z20828 Contact with and (suspected) exposure to other viral communicable diseases: Secondary | ICD-10-CM | POA: Diagnosis not present

## 2018-07-29 DIAGNOSIS — I2510 Atherosclerotic heart disease of native coronary artery without angina pectoris: Secondary | ICD-10-CM | POA: Diagnosis not present

## 2018-07-29 DIAGNOSIS — I10 Essential (primary) hypertension: Secondary | ICD-10-CM | POA: Diagnosis not present

## 2018-07-29 DIAGNOSIS — R319 Hematuria, unspecified: Secondary | ICD-10-CM | POA: Diagnosis not present

## 2018-07-29 DIAGNOSIS — E039 Hypothyroidism, unspecified: Secondary | ICD-10-CM | POA: Diagnosis not present

## 2018-07-29 DIAGNOSIS — H903 Sensorineural hearing loss, bilateral: Secondary | ICD-10-CM | POA: Diagnosis not present

## 2018-07-29 DIAGNOSIS — S81801D Unspecified open wound, right lower leg, subsequent encounter: Secondary | ICD-10-CM | POA: Diagnosis not present

## 2018-07-29 DIAGNOSIS — W1830XA Fall on same level, unspecified, initial encounter: Secondary | ICD-10-CM | POA: Diagnosis not present

## 2018-07-29 DIAGNOSIS — N390 Urinary tract infection, site not specified: Secondary | ICD-10-CM | POA: Diagnosis not present

## 2018-07-29 DIAGNOSIS — E559 Vitamin D deficiency, unspecified: Secondary | ICD-10-CM | POA: Diagnosis not present

## 2018-07-29 DIAGNOSIS — R410 Disorientation, unspecified: Secondary | ICD-10-CM | POA: Diagnosis not present

## 2018-07-29 DIAGNOSIS — S72432A Displaced fracture of medial condyle of left femur, initial encounter for closed fracture: Secondary | ICD-10-CM | POA: Diagnosis not present

## 2018-08-05 DIAGNOSIS — I4891 Unspecified atrial fibrillation: Secondary | ICD-10-CM | POA: Diagnosis not present

## 2018-08-05 DIAGNOSIS — R262 Difficulty in walking, not elsewhere classified: Secondary | ICD-10-CM | POA: Diagnosis not present

## 2018-08-05 DIAGNOSIS — M810 Age-related osteoporosis without current pathological fracture: Secondary | ICD-10-CM | POA: Diagnosis not present

## 2018-08-05 DIAGNOSIS — D649 Anemia, unspecified: Secondary | ICD-10-CM | POA: Diagnosis not present

## 2018-08-09 DIAGNOSIS — S72433A Displaced fracture of medial condyle of unspecified femur, initial encounter for closed fracture: Secondary | ICD-10-CM | POA: Diagnosis not present

## 2018-08-16 DIAGNOSIS — S81801A Unspecified open wound, right lower leg, initial encounter: Secondary | ICD-10-CM | POA: Diagnosis not present

## 2018-08-16 DIAGNOSIS — S81802A Unspecified open wound, left lower leg, initial encounter: Secondary | ICD-10-CM | POA: Diagnosis not present

## 2018-08-23 DIAGNOSIS — S81802D Unspecified open wound, left lower leg, subsequent encounter: Secondary | ICD-10-CM | POA: Diagnosis not present

## 2018-08-23 DIAGNOSIS — S81801D Unspecified open wound, right lower leg, subsequent encounter: Secondary | ICD-10-CM | POA: Diagnosis not present

## 2018-08-26 DIAGNOSIS — M810 Age-related osteoporosis without current pathological fracture: Secondary | ICD-10-CM | POA: Diagnosis not present

## 2018-08-26 DIAGNOSIS — N183 Chronic kidney disease, stage 3 (moderate): Secondary | ICD-10-CM | POA: Diagnosis not present

## 2018-08-26 DIAGNOSIS — N390 Urinary tract infection, site not specified: Secondary | ICD-10-CM | POA: Diagnosis not present

## 2018-08-26 DIAGNOSIS — F0390 Unspecified dementia without behavioral disturbance: Secondary | ICD-10-CM | POA: Diagnosis not present

## 2018-08-26 DIAGNOSIS — I2510 Atherosclerotic heart disease of native coronary artery without angina pectoris: Secondary | ICD-10-CM | POA: Diagnosis not present

## 2018-08-30 DIAGNOSIS — S81802D Unspecified open wound, left lower leg, subsequent encounter: Secondary | ICD-10-CM | POA: Diagnosis not present

## 2018-08-30 DIAGNOSIS — S81801D Unspecified open wound, right lower leg, subsequent encounter: Secondary | ICD-10-CM | POA: Diagnosis not present

## 2018-09-01 DIAGNOSIS — S72432D Displaced fracture of medial condyle of left femur, subsequent encounter for closed fracture with routine healing: Secondary | ICD-10-CM | POA: Diagnosis not present

## 2018-09-01 DIAGNOSIS — S72433A Displaced fracture of medial condyle of unspecified femur, initial encounter for closed fracture: Secondary | ICD-10-CM | POA: Diagnosis not present

## 2018-09-06 DIAGNOSIS — S81801D Unspecified open wound, right lower leg, subsequent encounter: Secondary | ICD-10-CM | POA: Diagnosis not present

## 2018-09-06 DIAGNOSIS — S81802D Unspecified open wound, left lower leg, subsequent encounter: Secondary | ICD-10-CM | POA: Diagnosis not present

## 2018-09-08 DIAGNOSIS — H903 Sensorineural hearing loss, bilateral: Secondary | ICD-10-CM | POA: Diagnosis not present

## 2018-09-13 DIAGNOSIS — S81802D Unspecified open wound, left lower leg, subsequent encounter: Secondary | ICD-10-CM | POA: Diagnosis not present

## 2018-09-13 DIAGNOSIS — S81801D Unspecified open wound, right lower leg, subsequent encounter: Secondary | ICD-10-CM | POA: Diagnosis not present

## 2018-09-20 DIAGNOSIS — S81801D Unspecified open wound, right lower leg, subsequent encounter: Secondary | ICD-10-CM | POA: Diagnosis not present

## 2018-09-20 DIAGNOSIS — S81802D Unspecified open wound, left lower leg, subsequent encounter: Secondary | ICD-10-CM | POA: Diagnosis not present

## 2018-09-23 DIAGNOSIS — G459 Transient cerebral ischemic attack, unspecified: Secondary | ICD-10-CM | POA: Diagnosis not present

## 2018-09-23 DIAGNOSIS — R918 Other nonspecific abnormal finding of lung field: Secondary | ICD-10-CM | POA: Diagnosis not present

## 2018-09-23 DIAGNOSIS — F329 Major depressive disorder, single episode, unspecified: Secondary | ICD-10-CM | POA: Diagnosis not present

## 2018-09-23 DIAGNOSIS — J984 Other disorders of lung: Secondary | ICD-10-CM | POA: Diagnosis not present

## 2018-09-23 DIAGNOSIS — I639 Cerebral infarction, unspecified: Secondary | ICD-10-CM | POA: Diagnosis not present

## 2018-09-23 DIAGNOSIS — E860 Dehydration: Secondary | ICD-10-CM | POA: Diagnosis not present

## 2018-09-23 DIAGNOSIS — N390 Urinary tract infection, site not specified: Secondary | ICD-10-CM | POA: Diagnosis not present

## 2018-09-23 DIAGNOSIS — R29810 Facial weakness: Secondary | ICD-10-CM | POA: Diagnosis not present

## 2018-09-23 DIAGNOSIS — E785 Hyperlipidemia, unspecified: Secondary | ICD-10-CM | POA: Diagnosis not present

## 2018-09-23 DIAGNOSIS — R531 Weakness: Secondary | ICD-10-CM | POA: Diagnosis not present

## 2018-09-23 DIAGNOSIS — I351 Nonrheumatic aortic (valve) insufficiency: Secondary | ICD-10-CM | POA: Diagnosis not present

## 2018-09-23 DIAGNOSIS — N183 Chronic kidney disease, stage 3 (moderate): Secondary | ICD-10-CM | POA: Diagnosis not present

## 2018-09-23 DIAGNOSIS — I361 Nonrheumatic tricuspid (valve) insufficiency: Secondary | ICD-10-CM | POA: Diagnosis not present

## 2018-09-23 DIAGNOSIS — I4891 Unspecified atrial fibrillation: Secondary | ICD-10-CM | POA: Diagnosis not present

## 2018-09-23 DIAGNOSIS — N179 Acute kidney failure, unspecified: Secondary | ICD-10-CM | POA: Diagnosis not present

## 2018-09-23 DIAGNOSIS — I959 Hypotension, unspecified: Secondary | ICD-10-CM | POA: Diagnosis not present

## 2018-09-23 DIAGNOSIS — G8191 Hemiplegia, unspecified affecting right dominant side: Secondary | ICD-10-CM | POA: Diagnosis not present

## 2018-09-24 DIAGNOSIS — I4891 Unspecified atrial fibrillation: Secondary | ICD-10-CM | POA: Diagnosis not present

## 2018-09-24 DIAGNOSIS — N183 Chronic kidney disease, stage 3 (moderate): Secondary | ICD-10-CM | POA: Diagnosis not present

## 2018-09-24 DIAGNOSIS — E785 Hyperlipidemia, unspecified: Secondary | ICD-10-CM | POA: Diagnosis not present

## 2018-09-24 DIAGNOSIS — N179 Acute kidney failure, unspecified: Secondary | ICD-10-CM | POA: Diagnosis not present

## 2018-09-24 DIAGNOSIS — E860 Dehydration: Secondary | ICD-10-CM | POA: Diagnosis not present

## 2018-09-24 DIAGNOSIS — G459 Transient cerebral ischemic attack, unspecified: Secondary | ICD-10-CM | POA: Diagnosis not present

## 2018-09-24 DIAGNOSIS — I639 Cerebral infarction, unspecified: Secondary | ICD-10-CM | POA: Diagnosis not present

## 2018-09-25 DIAGNOSIS — G459 Transient cerebral ischemic attack, unspecified: Secondary | ICD-10-CM | POA: Diagnosis not present

## 2018-09-25 DIAGNOSIS — E860 Dehydration: Secondary | ICD-10-CM | POA: Diagnosis not present

## 2018-09-25 DIAGNOSIS — E785 Hyperlipidemia, unspecified: Secondary | ICD-10-CM | POA: Diagnosis not present

## 2018-09-25 DIAGNOSIS — N183 Chronic kidney disease, stage 3 (moderate): Secondary | ICD-10-CM | POA: Diagnosis not present

## 2018-09-25 DIAGNOSIS — I4891 Unspecified atrial fibrillation: Secondary | ICD-10-CM | POA: Diagnosis not present

## 2018-09-25 DIAGNOSIS — N179 Acute kidney failure, unspecified: Secondary | ICD-10-CM | POA: Diagnosis not present

## 2018-09-26 DIAGNOSIS — B961 Klebsiella pneumoniae [K. pneumoniae] as the cause of diseases classified elsewhere: Secondary | ICD-10-CM | POA: Diagnosis not present

## 2018-09-26 DIAGNOSIS — N179 Acute kidney failure, unspecified: Secondary | ICD-10-CM | POA: Diagnosis not present

## 2018-09-26 DIAGNOSIS — E039 Hypothyroidism, unspecified: Secondary | ICD-10-CM | POA: Diagnosis not present

## 2018-09-26 DIAGNOSIS — E860 Dehydration: Secondary | ICD-10-CM | POA: Diagnosis not present

## 2018-09-26 DIAGNOSIS — E785 Hyperlipidemia, unspecified: Secondary | ICD-10-CM | POA: Diagnosis not present

## 2018-09-26 DIAGNOSIS — R300 Dysuria: Secondary | ICD-10-CM | POA: Diagnosis not present

## 2018-09-26 DIAGNOSIS — G8191 Hemiplegia, unspecified affecting right dominant side: Secondary | ICD-10-CM | POA: Diagnosis not present

## 2018-09-26 DIAGNOSIS — I499 Cardiac arrhythmia, unspecified: Secondary | ICD-10-CM | POA: Diagnosis not present

## 2018-09-26 DIAGNOSIS — N390 Urinary tract infection, site not specified: Secondary | ICD-10-CM | POA: Diagnosis not present

## 2018-09-26 DIAGNOSIS — F329 Major depressive disorder, single episode, unspecified: Secondary | ICD-10-CM | POA: Diagnosis not present

## 2018-09-26 DIAGNOSIS — F05 Delirium due to known physiological condition: Secondary | ICD-10-CM | POA: Diagnosis not present

## 2018-09-26 DIAGNOSIS — D62 Acute posthemorrhagic anemia: Secondary | ICD-10-CM | POA: Diagnosis not present

## 2018-09-26 DIAGNOSIS — N183 Chronic kidney disease, stage 3 (moderate): Secondary | ICD-10-CM | POA: Diagnosis not present

## 2018-09-26 DIAGNOSIS — R410 Disorientation, unspecified: Secondary | ICD-10-CM | POA: Diagnosis not present

## 2018-09-26 DIAGNOSIS — I4821 Permanent atrial fibrillation: Secondary | ICD-10-CM | POA: Diagnosis not present

## 2018-09-26 DIAGNOSIS — Z743 Need for continuous supervision: Secondary | ICD-10-CM | POA: Diagnosis not present

## 2018-09-26 DIAGNOSIS — I4891 Unspecified atrial fibrillation: Secondary | ICD-10-CM | POA: Diagnosis not present

## 2018-09-26 DIAGNOSIS — G459 Transient cerebral ischemic attack, unspecified: Secondary | ICD-10-CM | POA: Diagnosis not present

## 2018-09-26 DIAGNOSIS — F0280 Dementia in other diseases classified elsewhere without behavioral disturbance: Secondary | ICD-10-CM | POA: Diagnosis not present

## 2018-09-26 DIAGNOSIS — R279 Unspecified lack of coordination: Secondary | ICD-10-CM | POA: Diagnosis not present

## 2018-09-27 DIAGNOSIS — I4821 Permanent atrial fibrillation: Secondary | ICD-10-CM | POA: Diagnosis not present

## 2018-09-27 DIAGNOSIS — I4891 Unspecified atrial fibrillation: Secondary | ICD-10-CM | POA: Diagnosis not present

## 2018-09-27 DIAGNOSIS — Z743 Need for continuous supervision: Secondary | ICD-10-CM | POA: Diagnosis not present

## 2018-09-27 DIAGNOSIS — I499 Cardiac arrhythmia, unspecified: Secondary | ICD-10-CM | POA: Diagnosis not present

## 2018-09-27 DIAGNOSIS — F0280 Dementia in other diseases classified elsewhere without behavioral disturbance: Secondary | ICD-10-CM | POA: Diagnosis not present

## 2018-09-27 DIAGNOSIS — F05 Delirium due to known physiological condition: Secondary | ICD-10-CM | POA: Diagnosis not present

## 2018-09-27 DIAGNOSIS — E785 Hyperlipidemia, unspecified: Secondary | ICD-10-CM | POA: Diagnosis not present

## 2018-09-27 DIAGNOSIS — E860 Dehydration: Secondary | ICD-10-CM | POA: Diagnosis not present

## 2018-09-27 DIAGNOSIS — I119 Hypertensive heart disease without heart failure: Secondary | ICD-10-CM | POA: Diagnosis not present

## 2018-09-27 DIAGNOSIS — R410 Disorientation, unspecified: Secondary | ICD-10-CM | POA: Diagnosis not present

## 2018-09-27 DIAGNOSIS — D62 Acute posthemorrhagic anemia: Secondary | ICD-10-CM | POA: Diagnosis not present

## 2018-09-27 DIAGNOSIS — R279 Unspecified lack of coordination: Secondary | ICD-10-CM | POA: Diagnosis not present

## 2018-09-27 DIAGNOSIS — F329 Major depressive disorder, single episode, unspecified: Secondary | ICD-10-CM | POA: Diagnosis not present

## 2018-09-27 DIAGNOSIS — E039 Hypothyroidism, unspecified: Secondary | ICD-10-CM | POA: Diagnosis not present

## 2018-09-27 DIAGNOSIS — S81802D Unspecified open wound, left lower leg, subsequent encounter: Secondary | ICD-10-CM | POA: Diagnosis not present

## 2018-09-27 DIAGNOSIS — G8191 Hemiplegia, unspecified affecting right dominant side: Secondary | ICD-10-CM | POA: Diagnosis not present

## 2018-09-27 DIAGNOSIS — N183 Chronic kidney disease, stage 3 (moderate): Secondary | ICD-10-CM | POA: Diagnosis not present

## 2018-09-27 DIAGNOSIS — R300 Dysuria: Secondary | ICD-10-CM | POA: Diagnosis not present

## 2018-09-27 DIAGNOSIS — S81801D Unspecified open wound, right lower leg, subsequent encounter: Secondary | ICD-10-CM | POA: Diagnosis not present

## 2018-09-27 DIAGNOSIS — N390 Urinary tract infection, site not specified: Secondary | ICD-10-CM | POA: Diagnosis not present

## 2018-09-27 DIAGNOSIS — N179 Acute kidney failure, unspecified: Secondary | ICD-10-CM | POA: Diagnosis not present

## 2018-09-27 DIAGNOSIS — B961 Klebsiella pneumoniae [K. pneumoniae] as the cause of diseases classified elsewhere: Secondary | ICD-10-CM | POA: Diagnosis not present

## 2018-09-27 DIAGNOSIS — G459 Transient cerebral ischemic attack, unspecified: Secondary | ICD-10-CM | POA: Diagnosis not present

## 2018-09-27 DIAGNOSIS — Z20828 Contact with and (suspected) exposure to other viral communicable diseases: Secondary | ICD-10-CM | POA: Diagnosis not present

## 2018-09-27 DIAGNOSIS — M79652 Pain in left thigh: Secondary | ICD-10-CM | POA: Diagnosis not present

## 2018-09-28 DIAGNOSIS — I4891 Unspecified atrial fibrillation: Secondary | ICD-10-CM | POA: Diagnosis not present

## 2018-09-28 DIAGNOSIS — I119 Hypertensive heart disease without heart failure: Secondary | ICD-10-CM | POA: Diagnosis not present

## 2018-09-28 DIAGNOSIS — G459 Transient cerebral ischemic attack, unspecified: Secondary | ICD-10-CM | POA: Diagnosis not present

## 2018-09-28 DIAGNOSIS — N390 Urinary tract infection, site not specified: Secondary | ICD-10-CM | POA: Diagnosis not present

## 2018-10-04 DIAGNOSIS — S81801D Unspecified open wound, right lower leg, subsequent encounter: Secondary | ICD-10-CM | POA: Diagnosis not present

## 2018-10-04 DIAGNOSIS — S81802D Unspecified open wound, left lower leg, subsequent encounter: Secondary | ICD-10-CM | POA: Diagnosis not present

## 2018-10-25 DIAGNOSIS — F0390 Unspecified dementia without behavioral disturbance: Secondary | ICD-10-CM | POA: Diagnosis not present

## 2018-10-25 DIAGNOSIS — N183 Chronic kidney disease, stage 3 (moderate): Secondary | ICD-10-CM | POA: Diagnosis not present

## 2018-10-25 DIAGNOSIS — D649 Anemia, unspecified: Secondary | ICD-10-CM | POA: Diagnosis not present

## 2018-10-25 DIAGNOSIS — E785 Hyperlipidemia, unspecified: Secondary | ICD-10-CM | POA: Diagnosis not present

## 2018-11-02 DIAGNOSIS — Z20828 Contact with and (suspected) exposure to other viral communicable diseases: Secondary | ICD-10-CM | POA: Diagnosis not present

## 2018-11-17 DIAGNOSIS — Z20828 Contact with and (suspected) exposure to other viral communicable diseases: Secondary | ICD-10-CM | POA: Diagnosis not present

## 2018-11-23 DIAGNOSIS — Z79899 Other long term (current) drug therapy: Secondary | ICD-10-CM | POA: Diagnosis not present

## 2018-11-23 DIAGNOSIS — D649 Anemia, unspecified: Secondary | ICD-10-CM | POA: Diagnosis not present

## 2018-11-23 DIAGNOSIS — I509 Heart failure, unspecified: Secondary | ICD-10-CM | POA: Diagnosis not present

## 2018-11-23 DIAGNOSIS — Z20828 Contact with and (suspected) exposure to other viral communicable diseases: Secondary | ICD-10-CM | POA: Diagnosis not present

## 2018-11-26 DIAGNOSIS — I4891 Unspecified atrial fibrillation: Secondary | ICD-10-CM | POA: Diagnosis not present

## 2018-11-26 DIAGNOSIS — I509 Heart failure, unspecified: Secondary | ICD-10-CM | POA: Diagnosis not present

## 2018-11-26 DIAGNOSIS — N183 Chronic kidney disease, stage 3 (moderate): Secondary | ICD-10-CM | POA: Diagnosis not present

## 2018-11-26 DIAGNOSIS — D649 Anemia, unspecified: Secondary | ICD-10-CM | POA: Diagnosis not present

## 2018-11-26 DIAGNOSIS — I2510 Atherosclerotic heart disease of native coronary artery without angina pectoris: Secondary | ICD-10-CM | POA: Diagnosis not present

## 2018-11-28 DIAGNOSIS — D649 Anemia, unspecified: Secondary | ICD-10-CM | POA: Diagnosis not present

## 2018-12-02 DIAGNOSIS — I10 Essential (primary) hypertension: Secondary | ICD-10-CM

## 2018-12-02 DIAGNOSIS — R410 Disorientation, unspecified: Secondary | ICD-10-CM

## 2018-12-02 DIAGNOSIS — K625 Hemorrhage of anus and rectum: Secondary | ICD-10-CM

## 2018-12-02 DIAGNOSIS — N3000 Acute cystitis without hematuria: Secondary | ICD-10-CM | POA: Diagnosis not present

## 2018-12-02 DIAGNOSIS — E039 Hypothyroidism, unspecified: Secondary | ICD-10-CM

## 2018-12-02 DIAGNOSIS — D62 Acute posthemorrhagic anemia: Secondary | ICD-10-CM | POA: Diagnosis not present

## 2018-12-03 DIAGNOSIS — D62 Acute posthemorrhagic anemia: Secondary | ICD-10-CM | POA: Diagnosis not present

## 2018-12-03 DIAGNOSIS — I10 Essential (primary) hypertension: Secondary | ICD-10-CM | POA: Diagnosis not present

## 2018-12-03 DIAGNOSIS — K625 Hemorrhage of anus and rectum: Secondary | ICD-10-CM | POA: Diagnosis not present

## 2018-12-03 DIAGNOSIS — N3000 Acute cystitis without hematuria: Secondary | ICD-10-CM | POA: Diagnosis not present

## 2018-12-04 DIAGNOSIS — I10 Essential (primary) hypertension: Secondary | ICD-10-CM | POA: Diagnosis not present

## 2018-12-04 DIAGNOSIS — D62 Acute posthemorrhagic anemia: Secondary | ICD-10-CM | POA: Diagnosis not present

## 2018-12-04 DIAGNOSIS — K625 Hemorrhage of anus and rectum: Secondary | ICD-10-CM | POA: Diagnosis not present

## 2018-12-04 DIAGNOSIS — N3000 Acute cystitis without hematuria: Secondary | ICD-10-CM | POA: Diagnosis not present

## 2018-12-05 DIAGNOSIS — D62 Acute posthemorrhagic anemia: Secondary | ICD-10-CM | POA: Diagnosis not present

## 2018-12-05 DIAGNOSIS — I10 Essential (primary) hypertension: Secondary | ICD-10-CM | POA: Diagnosis not present

## 2018-12-05 DIAGNOSIS — K625 Hemorrhage of anus and rectum: Secondary | ICD-10-CM | POA: Diagnosis not present

## 2018-12-05 DIAGNOSIS — N3000 Acute cystitis without hematuria: Secondary | ICD-10-CM | POA: Diagnosis not present

## 2018-12-06 DIAGNOSIS — N3000 Acute cystitis without hematuria: Secondary | ICD-10-CM | POA: Diagnosis not present

## 2018-12-06 DIAGNOSIS — D62 Acute posthemorrhagic anemia: Secondary | ICD-10-CM | POA: Diagnosis not present

## 2018-12-06 DIAGNOSIS — K625 Hemorrhage of anus and rectum: Secondary | ICD-10-CM | POA: Diagnosis not present

## 2018-12-06 DIAGNOSIS — I10 Essential (primary) hypertension: Secondary | ICD-10-CM | POA: Diagnosis not present

## 2018-12-07 DIAGNOSIS — K625 Hemorrhage of anus and rectum: Secondary | ICD-10-CM | POA: Diagnosis not present

## 2018-12-07 DIAGNOSIS — D62 Acute posthemorrhagic anemia: Secondary | ICD-10-CM | POA: Diagnosis not present

## 2018-12-07 DIAGNOSIS — I10 Essential (primary) hypertension: Secondary | ICD-10-CM | POA: Diagnosis not present

## 2018-12-07 DIAGNOSIS — N3000 Acute cystitis without hematuria: Secondary | ICD-10-CM | POA: Diagnosis not present

## 2018-12-08 DIAGNOSIS — K625 Hemorrhage of anus and rectum: Secondary | ICD-10-CM | POA: Diagnosis not present

## 2018-12-08 DIAGNOSIS — D62 Acute posthemorrhagic anemia: Secondary | ICD-10-CM | POA: Diagnosis not present

## 2018-12-08 DIAGNOSIS — I10 Essential (primary) hypertension: Secondary | ICD-10-CM | POA: Diagnosis not present

## 2018-12-08 DIAGNOSIS — N3000 Acute cystitis without hematuria: Secondary | ICD-10-CM | POA: Diagnosis not present

## 2018-12-27 DIAGNOSIS — D509 Iron deficiency anemia, unspecified: Secondary | ICD-10-CM | POA: Diagnosis not present

## 2019-02-08 DIAGNOSIS — D509 Iron deficiency anemia, unspecified: Secondary | ICD-10-CM

## 2019-05-24 DIAGNOSIS — D500 Iron deficiency anemia secondary to blood loss (chronic): Secondary | ICD-10-CM | POA: Diagnosis not present

## 2019-11-01 DIAGNOSIS — D500 Iron deficiency anemia secondary to blood loss (chronic): Secondary | ICD-10-CM

## 2023-10-29 DEATH — deceased
